# Patient Record
Sex: Male | Born: 1955 | Race: White | Hispanic: No | Marital: Married | Smoking: Never smoker
Health system: Southern US, Community
[De-identification: ages and names within clinical notes are randomized; demographics above are authoritative.]

## PROBLEM LIST (undated history)

## (undated) DIAGNOSIS — Z8679 Personal history of other diseases of the circulatory system: Secondary | ICD-10-CM

## (undated) DIAGNOSIS — Z9621 Cochlear implant status: Secondary | ICD-10-CM

## (undated) HISTORY — PX: COCHLEAR IMPLANT: SUR684

## (undated) HISTORY — DX: Cochlear implant status: Z96.21

## (undated) HISTORY — DX: Personal history of other diseases of the circulatory system: Z86.79

---

## 2005-11-03 ENCOUNTER — Ambulatory Visit (HOSPITAL_COMMUNITY): Admission: RE | Admit: 2005-11-03 | Discharge: 2005-11-03 | Payer: Self-pay | Admitting: Otolaryngology

## 2007-10-06 ENCOUNTER — Ambulatory Visit (HOSPITAL_COMMUNITY): Admission: RE | Admit: 2007-10-06 | Discharge: 2007-10-07 | Payer: Self-pay | Admitting: Otolaryngology

## 2007-12-15 DIAGNOSIS — Z9621 Cochlear implant status: Secondary | ICD-10-CM

## 2007-12-15 HISTORY — DX: Cochlear implant status: Z96.21

## 2008-07-04 IMAGING — CR DG CHEST 2V
2 series · 2 of 2 positions shown · non-contrast
Comparison: None.

CLINICAL DATA: Cochlear implants on the right.  Preoperative respiratory exam. 
 CHEST - 2 VIEW:

[view not recorded (1 of 2)]
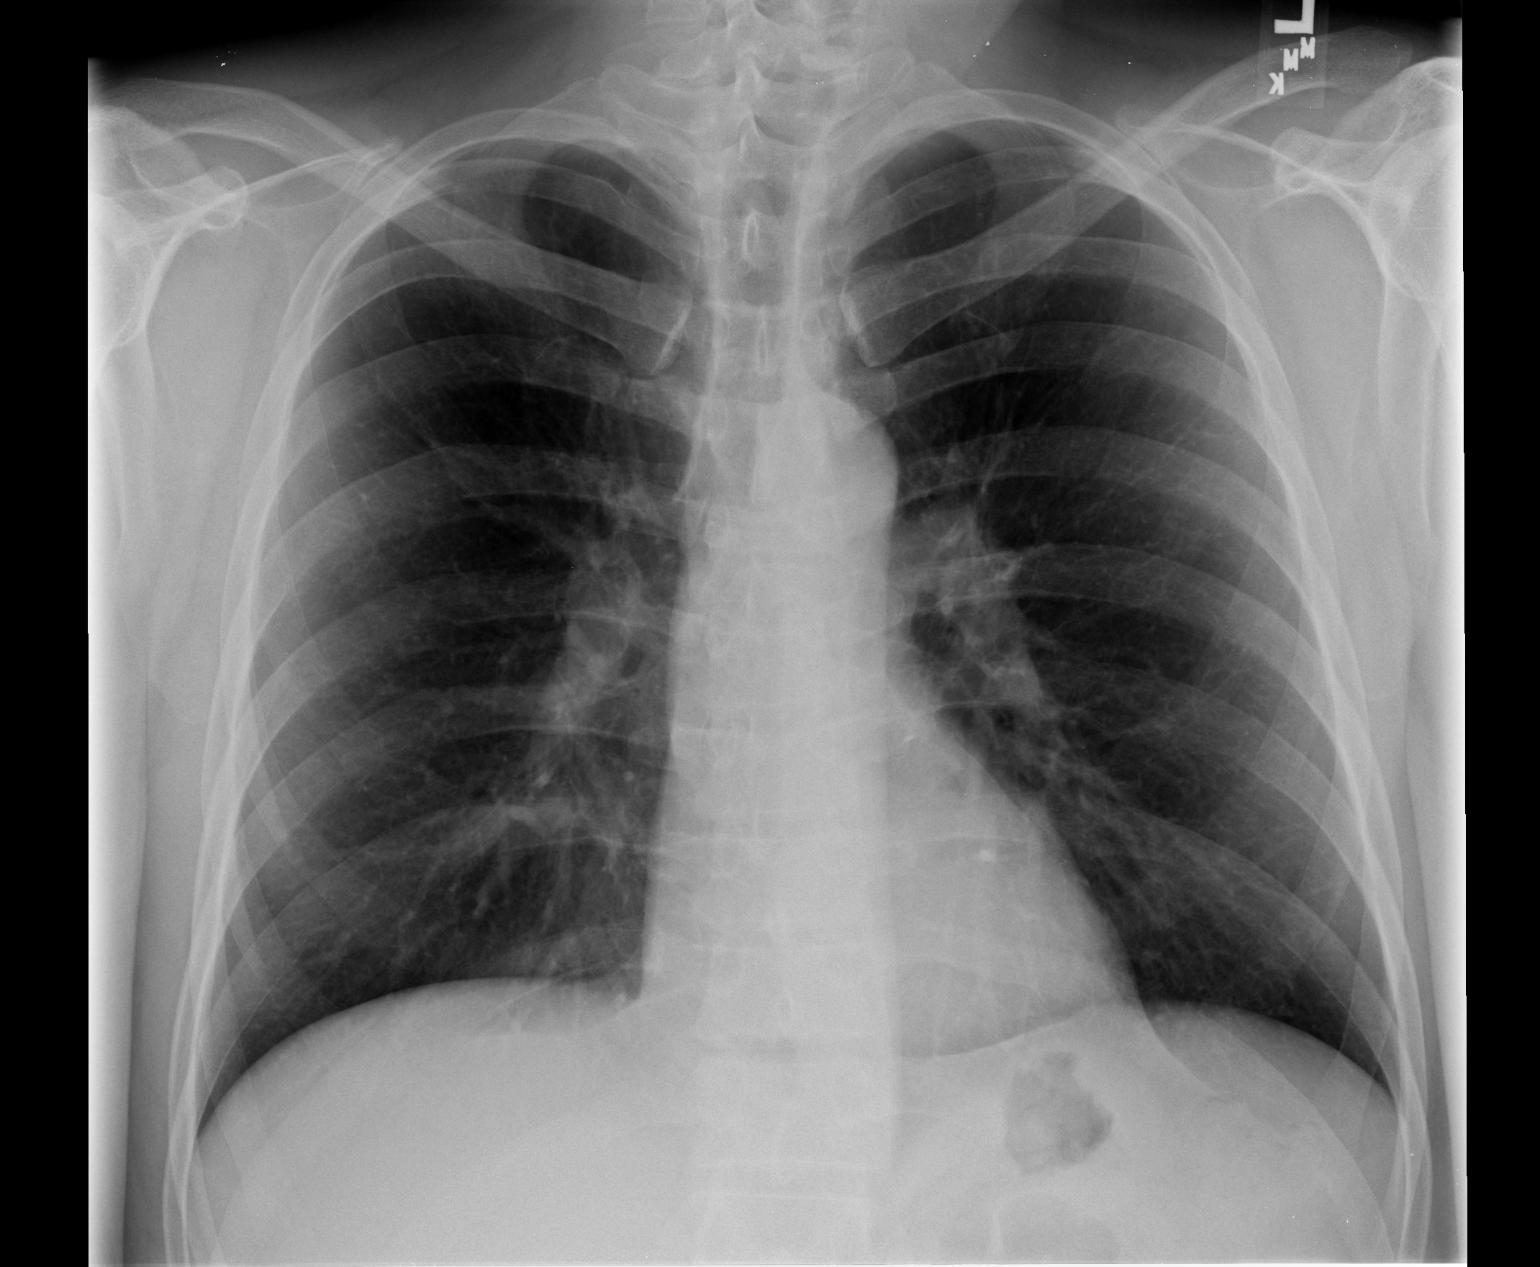

[view not recorded (2 of 2)]
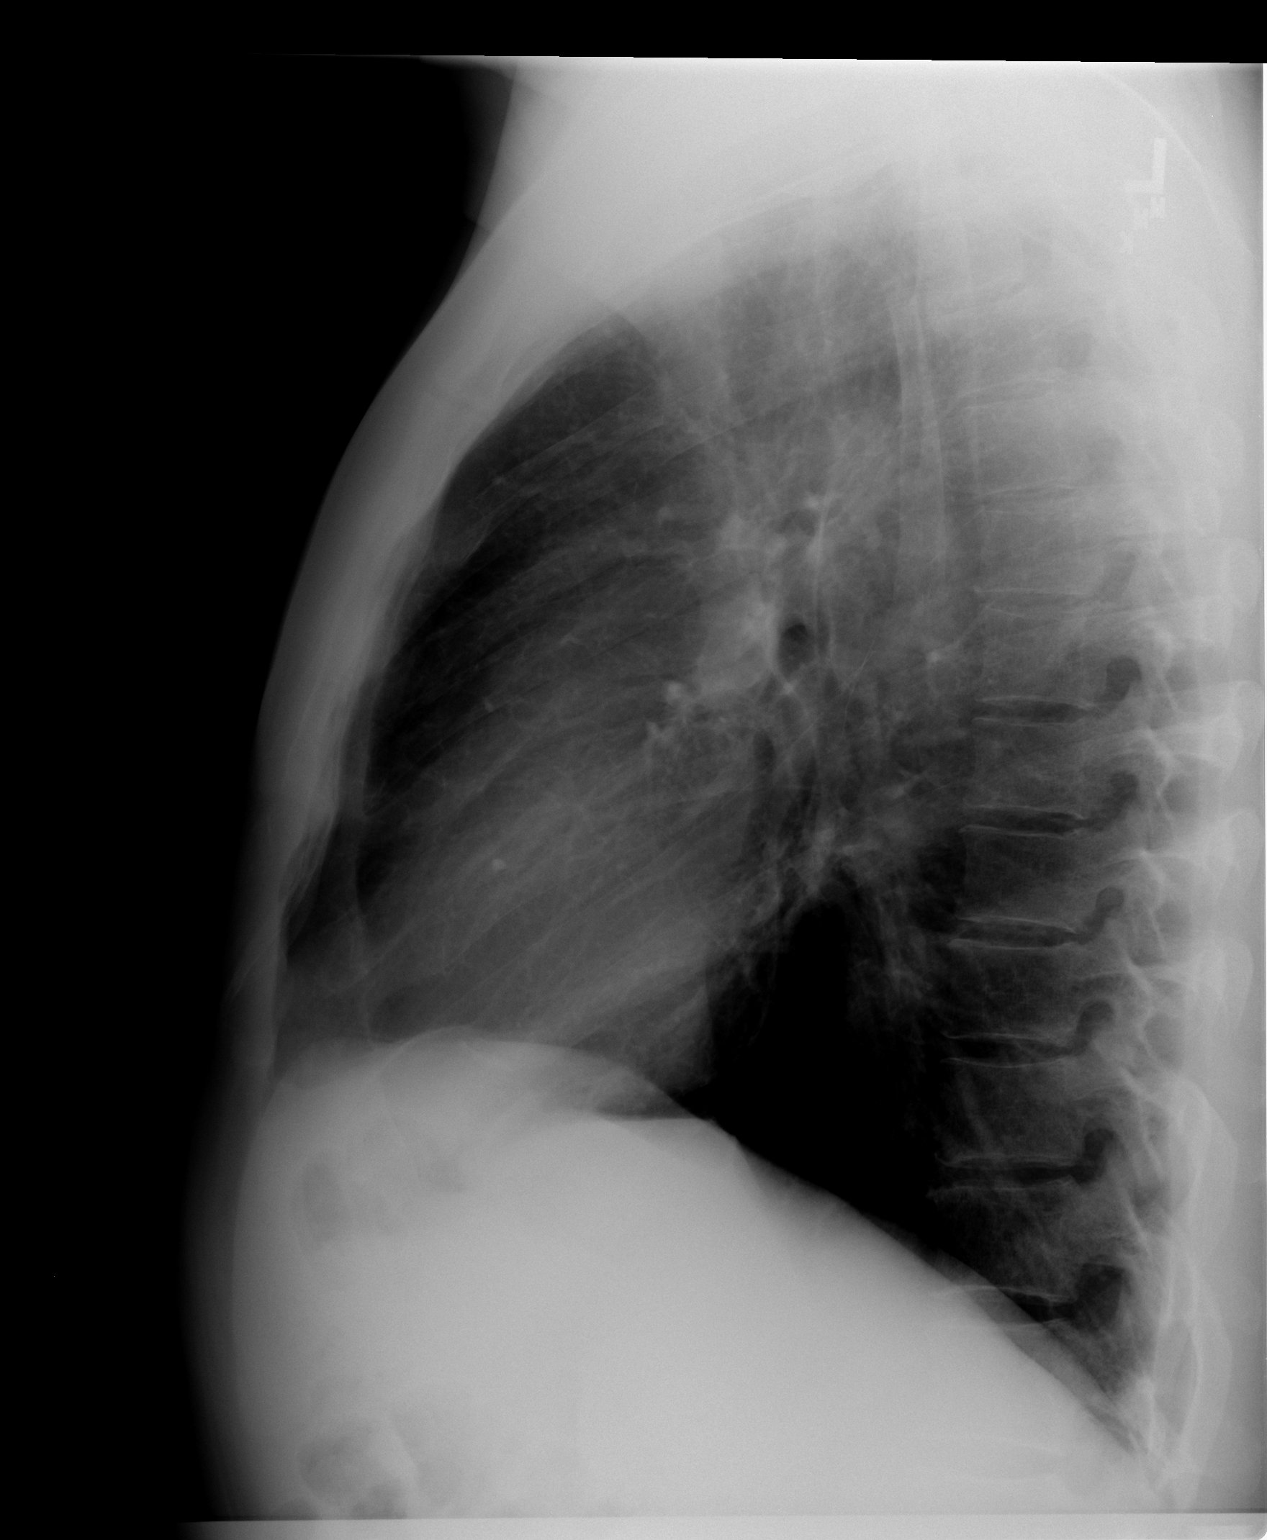

[2 of 2 positions shown; findings below may reference images not displayed]

FINDINGS: Heart size is normal.  The mediastinum is unremarkable.  The lungs are clear.  No effusions.  No soft tissue or bony abnormality.
IMPRESSION: Normal chest.

## 2011-04-28 NOTE — Discharge Summary (Signed)
Dylan Reid, Dylan Reid               ACCOUNT NO.:  [id number]   MEDICAL RECORD NO.:  [id number]          PATIENT TYPE:  OIB   LOCATION:  5741                         FACILITY:  [facility]   PHYSICIAN:  Carolan Shiver, M.D.    DATE OF BIRTH:  11-Oct-1956   DATE OF ADMISSION:  10/06/2007  DATE OF DISCHARGE:  10/07/2007                               DISCHARGE SUMMARY   ADMISSION DIAGNOSIS:  Profound bilateral sensorineural hearing loss  without benefit from hearing aids.   DISCHARGE DIAGNOSIS:  Profound bilateral sensorineural hearing loss  without benefit from hearing aids.   OPERATION:  [employer] Clarion high-resolution 90K with high-focus  1-J electrode multichannel cochlear implant, right ear.  Surgeon:  Carolan Shiver, M.D.  Anesthesia:  General endotracheal, Dr. Jean Rosenthal.   COMPLICATIONS:  None.   DISCHARGE STATUS:  Stable.   SUMMARY OF HOSPITALIZATION:  Dylan Reid is a 55 year old white  male with profound bilateral sensorineural hearing loss, unable to gain  any benefit from hearing aids.  He had had a longstanding hearing loss  in the right ear.  Beginning 4years ago he began to lose all of his  hearing in the left ear.  He was wearing a left BTE hearing aid for  tactile sensation.  He met all the criteria for multichannel cochlear  implantation.  A CT scan of his temporal bones was within normal limits  and an MRI head scan was negative.  He underwent a Pneumovax vaccination  and extensive preoperative counseling.  He understood that there were no  guarantees that he would regain hearing a sensation from a cochlear  implant, and he accepted the risk.  He was told a third of patients were  low performers, a third were medium performers, and a third were high  performers.  He understood that there was brain training involved in  learning how to use a cochlear implant, that it would take at least 12-  24 months for him to enjoy any benefit from the device.  He and his  wife  were extensively counseled concerning multichannel cochlear  implantation.  He read and signed our three-page informed consent form,  version 10.05.  All risks and complications were explained to him,  questions were invited and answered, and the informed consent was signed  and witnessed.   On October 06, 2007, he was taken to [facility] operating room #3 and  underwent an uncomplicated implantation of an ABC Clarion high-res 90K  with high-focus 1-J electrode implant in his right ear.  He had mobile  ossicles.  His facial nerve was intact and stimulated at 0.3 mA  throughout the case.  His chorda tympani nerve was sacrificed as its  course was directly through the middle of the facial recess.  A full-  length insertion of the electrode array was accomplished with a model  number CI-1400-01, serial number [id number].  Bony well was drilled and the  ICS was stabilized by suturing it to the skull with a 5-0 Nurolon  suture.  A drain was used.   The patient was recovered in  the PACU and then transferred to 5700, room  5741, where he had an afebrile, uncomplicated postoperative course.  He  was awake, alert and stable on the night of the procedure and had  minimal JP drainage.  He had intact facial function, no nystagmus, and  had some mild disequilibrium when he got up but no true vertigo.   By the first postoperative day, October 07, 2007, he was awake, alert,  afebrile, and had stable vital signs.  His facial function was intact.  He had no nystagmus and was reporting hunger.  His JP drain was  discontinued.  The incision was stable without hematoma or bleeding.  There was less than 30 mL from his JP drain.   [name] was discharged on the morning of October 07, 2007, with his  wife, who was instructed to return him to my office on October 13, 2007,  at 2:50 p.m.   DISCHARGE MEDICATIONS:  1. Ceftin 500 mg #20, one p.o. b.i.d. x10 days with food.  2. Vicodin one to two p.o.  q.6h. p.r.n. pain, #30.  3. Phenergan #2, 25 mg suppositories one PR q.6h. p.r.n. nausea.  4. Valium 5 mg tabs #20, one p.o. t.i.d. p.r.n. vertigo.   He is to keep his head elevated, avoid pressure over the incision, and  avoid heavy lifting or straining over the next week.  He was to perform  only light activities and follow a regular diet.  He or his wife are to  call [phone] for any postoperative problems.  He and his wife were  given both verbal and written instructions.   Admission laboratory data showed a hemoglobin of 16.7, hematocrit of  49.3, white blood cell count 8900.  PT was 12.4, PTT 33, INR 0.9.  Electrolytes were within normal limits.  Chest x-ray showed no active  disease.  EKG showed some sinus bradycardia.  Postoperative portable x-  rays done in the operating room were read by Dr. Paulina Fusi and showed  the cochlear implant to be in place.  There were no pathology specimens  pending.     During during the hospitalization the patient was in [facility] OR  room #3, the PACU, and 5741.           ______________________________  Carolan Shiver, M.D.     EMK/MEDQ  D:  10/07/2007  T:  10/08/2007  Job:  [id number]   cc:   [facility]

## 2011-04-28 NOTE — H&P (Signed)
NAMEARSENIY, TOOMEY               ACCOUNT NO.:  [id number]   MEDICAL RECORD NO.:  [id number]          PATIENT TYPE:  OIB   LOCATION:  5741                         FACILITY:  [facility]   PHYSICIAN:  Carolan Shiver, M.D.    DATE OF BIRTH:  1956-04-14   DATE OF ADMISSION:  10/06/2007  DATE OF DISCHARGE:  10/07/2007                              HISTORY & PHYSICAL   CHIEF COMPLAINT:  Bilateral profound sensorineural hearing loss not  benefiting from hearing aid, left ear.   HISTORY OF PRESENT ILLNESS:  Dylan Reid is a very pleasant 55-  year-old white male who is being admitted to the hospital for a  multichannel cochlear implant, right ear.  Mr. Sarate presented on  November 03, 2007, with a history of gradual progressive bilateral  sensorineural hearing loss, right greater than left.  He thinks he had  been losing hearing in the right ear for many, many years in a very  gradual process, but at least since 2004.  He then began losing hearing  in his left ear in 2004.  Old audiograms from February 21, 2003, and March 06, 2003, showed moderately severe sensorineural hearing loss in the  right ear with 12% discrimination and a mild sensorineural hearing loss  in the left ear with 96% discrimination.  Over the last four years, he  has continued to go on to lose all the hearing in both ears with no  discrimination.  He was counseled that he might benefit from a  multichannel cochlear implant and that there were no guarantees that he  would regain a hearing sensation.  He was also counseled that he could  do nothing or continue to wear his hearing aid in the left ear.  He  chose to proceed with a multichannel cochlear implant.   In preparation for the multichannel cochlear implant, he underwent CT  scanning of his temporal bones on November 03, 2005.  This showed normal  temporal bones and showed normal cochlear anatomy with 2-1/2 turns of  each cochlear and the labyrinthine and facial  nerve structures were  within normal limits.  He underwent CNC and HINT testing, both of which  showed 0%.  He underwent a Pneumovax vaccine in September 2008 in  preparation for multichannel cochlear implant.   Mr. and Mrs. Macmillan were extensively counseled concerning the risks and  benefits of cochlear implantation.  They understood that there were no  guarantees.  Mr. Holland signed our written three page special cochlear  implant informed consent, version 10.05.  He was also supplied with  cochlear implant information concerning the Harmony System, including  DVTs and written material.   Risks and complications of multichannel cochlear implant were explained  to Mr. and Mrs. Su Hilt.  Questions were invited and answered and  informed consent was signed and witnessed.  He was scheduled for the OR  on October 06, 2007, at 7:30 a.m., room #3 Eligha Bridegroom. [facility] Main OR.   PAST MEDICAL HISTORY:  No serious illnesses or operations reported.   MEDICATIONS:  None reported.  ALLERGIES:  None reported.   FAMILY HISTORY:  Positive for a son who has hearing loss.  His son,  [name], underwent repair of congenital bilateral purulent fistulae in  1990 and 1991.   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  He does use tobacco  products.  Does not drink alcohol.   REVIEW OF SYSTEMS:  Positive for the bilateral sensorineural hearing  loss, otherwise no difficulty with his heart, lungs, liver, kidneys or  history of diabetes mellitus, thyroid dysfunction or GI difficulty.   PHYSICAL EXAMINATION:  GENERAL APPEARANCE:  He is a well-developed, well-  nourished 55 year old white male in no acute distress with hearing  impaired type speech, wearing a BTE hearing aid in the left ear.  The  hearing aid was provided no benefit.  He was awake, alert, stable,  coherent, spontaneous and logical.  HEENT:  Facial  function was intact.  No nystagmus.  Pupils PERLA.  External ears and canals were stable.  TMs were clear and mobile  bilaterally.  Nose negative.  Oral cavity, lips, tongue and palate  normal.  NECK:  Negative.  CHEST:  Clear.  HEART:  Normal sinus rhythm.  ABDOMEN:  Benign.  GENITALIA/RECTAL:  Exams were not done.  EXTREMITIES:  Normal.  NEUROLOGIC:  Exam was physiologic with the exception of the profound  bilateral deafness.   PREOPERATIVE DIAGNOSTIC TESTING:  Audiometric testing on October 03, 2007, showed profound bilateral sensorineural hearing loss with SATs of  90 dB left ear and 100 dB right ear.  CNC and HINT testing showed 0%  correct words.  Preop CT scan of his temporal bones performed November 03, 2005, showed normal cochlear and labyrinthine anatomy.  ENG testing  showed asymmetric chloric responses with 19 degrees per second right  ear, 8 degrees per second left ear.  Promontory testing was not  performed.   Preoperative hemoglobin was 16.7, hematocrit 49.3, white blood cell  count 8900.  PT was 12.4, PTT 33, INR 0.9.  Electrolytes were within  normal limits.   Chest x-ray showed heart was normal in size.  Lungs were clear.  There  were no effusions.  He had a normal chest x-ray.   EKG showed sinus bradycardia.   IMPRESSION:  Profound bilateral sensorineural hearing loss without  benefit from BTE hearing aids.   RECOMMENDATIONS:  1. The patient is recommended for a ABC Clarion high-res 90 K with      high focus 1J electrode multichannel cochlear implant right ear.      He desired the right ear because he thought he still had gained      some slight sound awareness from his hearing aid on the left side.      Eventually will be recommended for binaural implantation.  2. Procedure is scheduled for October 06, 2007, [place]. [facility] Main OR room #3 under general endotracheal anesthesia.      Again, risks and complications of the procedures  were explained to      Mr. and Mrs. Cates questions were invited and answered and our      three page informed consent for cochlear implant version 10.05 was      read and signed by the patient.           ______________________________  Carolan Shiver, M.D.     EMK/MEDQ  D:  10/06/2007  T:  10/07/2007  Job:  [id number]

## 2011-04-28 NOTE — Op Note (Signed)
NAMEYAVIER, Reid NO.:  [id number]   MEDICAL RECORD NO.:  [id number]          PATIENT TYPE:  OIB   LOCATION:  5741                         FACILITY:  [facility]   PHYSICIAN:  Carolan Shiver, M.D.    DATE OF BIRTH:  1956-01-19   DATE OF PROCEDURE:  10/06/2007  DATE OF DISCHARGE:  10/07/2007                               OPERATIVE REPORT   JUSTIFICATION FOR PROCEDURE:  Dylan Reid is a 55 year old white  male here today for a Clarion high-resolution 90K multichannel cochlear  implant, right ear, to treat profound bilateral sensorineural hearing  loss.  Dylan Reid was seen by me on November 02, 2005; at that time, he  had been referred by Ms. Dylan Reid of [facility] in  [place], [place].  Dylan Reid stated that he could not hear  at all from his right ear and had been experiencing progressive hearing  loss in his left ear.  It seemed as if his hearing had been decreasing  for a long time and was gradual.  He began to lose hearing in his left  ear in 2004.  Two old audiograms were available, one from February 21, 2003  and one from March 06, 2003, both of which demonstrated moderately  severe hearing loss in his right ear with 12% discrimination and only  mild sensorineural hearing loss in his left ear with 96% discrimination.  Over that 4-year, he lost all the hearing in both ears, just recently,  the remaining hearing in the left ear.  He denied any otorrhea, otalgia,  tenderness or vertigo.  He had had some noise exposure in his work  environment and around race cars.  He did not use firearms.  There was a  negative family history in terms of his parents and siblings; however,  he has a son who has moderately severe bilateral sensorineural hearing  loss.  His son Dylan Reid had bilateral congenital perilymph fistulae and  underwent sealing of the fistulae during earlier childhood.   Audiometric testing on October 11, 2007 showed virtually  profound  bilateral sensorineural hearing loss with thresholds in the 95- to 110-  dB range across the frequency spectrum and his left ear had an SAT at 90  dB and his right ear, an SAT at 100 dB.  In essence, his hearing aid in  the left ear was not providing any benefit.   Dylan Reid was counseled that he could do nothing, continue to wear his  left hearing aid or proceed with a multichannel cochlear implant; he  desired to have the implant placed in his right ear.  A CT scan of his  temporal bones performed on November 03, 2005 showed normal 2-1/2 turns  of each cochlea and the labyrinthine structures and facial nerve were  within normal limits.  Dylan Reid also underwent HINT and CNC testing.  On the CNC list 2 test, he had 0 correct words and phonemes and on HINT  test , had 0% correct.  He received a Pneumovax vaccine in September  2008 in preparation for the procedure.  Risks and complications of the procedures were explained to [name], questions were invited and answered and informed consent was  read and signed.  He read our 3-page special cochlear implant informed  consent form dated version 10.05.   He was scheduled for the procedure today, October 06, 2007, under  general endotracheal anesthesia with a 24-hour overnight stay.   JUSTIFICATION FOR OUTPATIENT SETTING:  Patient's age and need for  general endotracheal anesthesia.   JUSTIFICATION FOR OVERNIGHT STAY:  Twenty three hours of observation of  facial function, status post multichannel cochlear implantation   PREOPERATIVE DIAGNOSIS:  Profound bilateral sensorineural hearing loss  in both ears.   POSTOPERATIVE DIAGNOSIS:  Profound bilateral sensorineural hearing loss  in both ears.   OPERATION:  1. Clarion high-resolution 90K with 1-J electrode multichannel      cochlear implant, right ear.  2. Microdissection using the operating room microscope.   SURGEON:  Carolan Shiver, M.D.    ANESTHESIA:  General endotracheal.   ANESTHESIOLOGIST:  Jairo Ben, MD   COMPLICATIONS:  None.   SUMMARY OF REPORT:  After the patient was taken to the operating room,  he was placed in the supine position.  IV had been begun in the holding  area.  General IV induction was performed under the guidance of Dr.  Jairo Ben.  The patient was orally intubated.  He was orally  intubated without difficulty.  Eyelids were taped shut and a Foley  catheter was inserted.  A small amount of hair was clipped in the right  postauricular areas, his hair was taped and a stockinette cap was  applied.  His right ear and hemiface were prepped with alcohol and an  inverted J cochlear implant incision was marked and infiltrated with 9  mL of 1% Xylocaine with 1:100,000 epinephrine.  Silver wire needle  electrodes were inserted in the right orbicularis and oculi muscles and  suprasternal notch connected to an M facial nerve monitor pre amplifier.  The patient's right ear and hemiface were then prepped with Betadine and  draped in a standard fashion for a cochlear implant.   An inverted J cochlear implant incision was then created, first by  making the postauricular portion of the incision and later the superior  limb was opened after the skin was infiltrated with another 4 mL of 1%  Xylocaine with 1:100,000 epinephrine.  A T-shaped incision was made in  the mastoid periosteum in the mastoid cortex was exposed.  The mastoid  air cell system was then entered with cutting burs using a continuous  suction and irrigation.  A limited mastoidectomy was performed, enough  to identify the sigmoid sinus and the digastric ridge periosteum.  The  antrum was entered and then the attic was opened to expose the short  process of the incus in the fossa incudis.  The facial recess was then  opened with a #2 match head Soft-Touch neurosurgical bur and 1- and 2-mm  diamond burs.  The ossicles were found to be  intact and mobile.  The  round window niche was normal in  appearance.  A cochleostomy was begun with the 2-mm diamond bur and  carried down until the scala tympani was egg-shelled.   Attention was then turned to the scalp area.  The superior portion of  the incision was then created and a small Palva flap was elevated.  The  previously marked site on the skull was then used at to drill a  bony  well for the ICS; this was done with a well template and sizer.  Trough  was drilled between the well and the posterosuperior lip of the mastoid  cavity, which had been undercut.  Two pairs of holes were then drilled  on either side of the bony well to suture the ICS to the bone.  The site  was then copiously irrigated with bacitracin-containing saline and  unipolar cautery was used to stop all bleeding.   Gowns and gloves were changed.  The cochleostomy was then opened with a  1-mm diamond bur to the size of a cochleostomy sizer.  I could easily  see around the first turn.   Next a Clarion high-resolution 90K cochlear implant with 1-J electrode,  serial number [id number], was opened and inspected; the implant was found to  look intact.  The implant was taken and placed in the bony well and  sutured to the bone with a 5-0 Surgilon suture.  The electrode array was  placed in the trough.   An inserter was then taken and lubricated with saline.  The plastic tube  was attached to the inserter and one electrode was extruded in order to  make sure the extrusion process would go smoothly.  The electrode array  was then withdrawn into the plastic insertion tube.  The tube slot was  angled at 10 o'clock and placed in the cochleostomy and with the tip of  the insertion tube at the first turn.  Gently and carefully, the  electrode array was then inserted into the cochlea without resistance;  this was done in one smooth motion and was not repeated.  Cochleostomy  was then sealed with small fascial squares and  the facial recess was  closed with a fascia muscle plug.  The electrode array was then placed  looped into the mastoid cavity under the undercut lip and held in place  with 3 rectangles of Gelfoam.  The Palva flap was then used to cover  over the fantail portion and electrode array and the mastoid periosteum  was closed.  The site was copiously irrigated with bacitracin-containing  saline.  A #7 Jackson-Pratt drain was inserted through a separate stab  incision.  The postauricular incision was then closed in 2 more layers  using interrupted inverted 3-0 Monocryl for the subcutaneous layer and  skin was closed with a running-locking 5-0 Ethilon.  An intraoperative x-  ray was taken and showed the electrode array to be well within the  cochlea.  Bacitracin ointment was applied to the incision.  The ear was  padded with Telfa and cotton and a standard adult Glasscock mastoid  dressing was applied loosely in the standard fashion.  The patient's  Foley catheter was removed.  He was awakened, extubated and transferred  to his hospital bed.  He appeared to tolerate the general endotracheal  anesthesia and the procedures well and left the operating room in stable  condition.   TOTAL FLUIDS:  2300 mL.   TOTAL BLOOD LOSS:  Less than 20 mL.   TOTAL URINE OUTPUT:  550 mL.   MEDICATIONS GIVEN:  The patient received Ancef 1 g IV and Zofran 4 mg IV  at the beginning and end of the procedure and Decadron 10 mg IV.   Dylan Reid will be admitted to a private room and observed overnight.  If stable and be discharged on October 07, 2007 in the morning.  He will  be instructed to return to my  office on October 13, 2007 at 2:50 p.m.  for suture removal and followup.  Anticipating that he will heal,  external hardware will be applied in approximately 3 weeks.   SUMMARY:  The patient underwent an uncomplicated cochlear implant, right  ear.  The implant used was a ABC Clarion high-resolution 90K implant   with a high-focus 1-J electrode, model number CI-1400-01, serial number  [id number].  A full-length insertion was accomplished on the first attempt.  The cochleostomy was sealed with fascia.  Ossicles were mobile.  The  patient had well-pneumatized mastoid.  There were no complications.  Intraoperative x-ray showed the electrode array to be within the  cochlea.  Facial nerve stimulated at 0.3 mA of the second genu.  Chorda  tympani nerve was sacrificed, as it went directly through the facial  recess.  There were no complications.           ______________________________  Carolan Shiver, M.D.     EMK/MEDQ  D:  10/06/2007  T:  10/07/2007  Job:  [id number]

## 2011-09-23 LAB — COMPREHENSIVE METABOLIC PANEL
ALT: 16
AST: 16
Albumin: 4.3
Alkaline Phosphatase: 61
BUN: 15
CO2: 27
Calcium: 9.8
Chloride: 107
Creatinine, Ser: 0.91
GFR calc Af Amer: >60
GFR calc non Af Amer: >60
Glucose, Bld: 91
Potassium: 4.9
Sodium: 142
Total Bilirubin: 0.7
Total Protein: 6.4

## 2011-09-23 LAB — DIFFERENTIAL
Basophils Absolute: 0
Basophils Relative: 1
Eosinophils Absolute: 0.3
Eosinophils Relative: 3
Lymphocytes Relative: 25
Lymphs Abs: 2.2
Monocytes Absolute: 0.7
Monocytes Relative: 8
Neutro Abs: 5.7
Neutrophils Relative %: 64

## 2011-09-23 LAB — URINALYSIS, ROUTINE W REFLEX MICROSCOPIC
Bilirubin Urine: NEGATIVE
Glucose, UA: NEGATIVE
Hgb urine dipstick: NEGATIVE
Ketones, ur: NEGATIVE
Nitrite: NEGATIVE
Protein, ur: NEGATIVE
Specific Gravity, Urine: 1.023
Urobilinogen, UA: 0.2
pH: 5

## 2011-09-23 LAB — APTT: aPTT: 33

## 2011-09-23 LAB — CBC
HCT: 49.3
Hemoglobin: 16.7
MCHC: 33.8
MCV: 96.3
Platelets: 167
RBC: 5.13
RDW: 13.3
WBC: 8.9

## 2011-09-23 LAB — PROTIME-INR
INR: 0.9
Prothrombin Time: 12.4

## 2015-10-15 DIAGNOSIS — H903 Sensorineural hearing loss, bilateral: Secondary | ICD-10-CM | POA: Diagnosis not present

## 2015-12-15 DIAGNOSIS — Z8679 Personal history of other diseases of the circulatory system: Secondary | ICD-10-CM

## 2015-12-15 HISTORY — DX: Personal history of other diseases of the circulatory system: Z86.79

## 2016-02-24 DIAGNOSIS — H2513 Age-related nuclear cataract, bilateral: Secondary | ICD-10-CM | POA: Diagnosis not present

## 2016-11-10 ENCOUNTER — Emergency Department (HOSPITAL_COMMUNITY): Payer: Medicare Other

## 2016-11-10 ENCOUNTER — Inpatient Hospital Stay (HOSPITAL_COMMUNITY)
Admission: EM | Admit: 2016-11-10 | Discharge: 2016-11-12 | DRG: 066 | Disposition: A | Discharge status: Home / Self Care | Payer: Medicare Other | Attending: Neurosurgery | Admitting: Neurosurgery

## 2016-11-10 ENCOUNTER — Encounter (HOSPITAL_COMMUNITY): Payer: Self-pay | Admitting: Emergency Medicine

## 2016-11-10 DIAGNOSIS — S066X0A Traumatic subarachnoid hemorrhage without loss of consciousness, initial encounter: Secondary | ICD-10-CM

## 2016-11-10 DIAGNOSIS — I609 Nontraumatic subarachnoid hemorrhage, unspecified: Secondary | ICD-10-CM | POA: Diagnosis not present

## 2016-11-10 DIAGNOSIS — Z9621 Cochlear implant status: Secondary | ICD-10-CM | POA: Diagnosis not present

## 2016-11-10 DIAGNOSIS — R51 Headache: Secondary | ICD-10-CM | POA: Diagnosis not present

## 2016-11-10 DIAGNOSIS — H9193 Unspecified hearing loss, bilateral: Secondary | ICD-10-CM | POA: Diagnosis present

## 2016-11-10 DIAGNOSIS — R030 Elevated blood-pressure reading, without diagnosis of hypertension: Secondary | ICD-10-CM | POA: Diagnosis not present

## 2016-11-10 DIAGNOSIS — G44209 Tension-type headache, unspecified, not intractable: Secondary | ICD-10-CM | POA: Diagnosis not present

## 2016-11-10 DIAGNOSIS — R061 Stridor: Secondary | ICD-10-CM | POA: Diagnosis not present

## 2016-11-10 DIAGNOSIS — R58 Hemorrhage, not elsewhere classified: Secondary | ICD-10-CM | POA: Diagnosis not present

## 2016-11-10 HISTORY — DX: Nontraumatic subarachnoid hemorrhage, unspecified: I60.9

## 2016-11-10 LAB — CBC WITH DIFFERENTIAL/PLATELET
Basophils Absolute: 0 10*3/uL (ref 0.0–0.1)
Basophils Relative: 0 %
Eosinophils Absolute: 0 10*3/uL (ref 0.0–0.7)
Eosinophils Relative: 0 %
HCT: 47.8 % (ref 39.0–52.0)
Hemoglobin: 16.5 g/dL (ref 13.0–17.0)
LYMPHS PCT: 16 %
Lymphs Abs: 1.8 10*3/uL (ref 0.7–4.0)
MCH: 33 pg (ref 26.0–34.0)
MCHC: 34.5 g/dL (ref 30.0–36.0)
MCV: 95.6 fL (ref 78.0–100.0)
Monocytes Absolute: 0.8 10*3/uL (ref 0.1–1.0)
Monocytes Relative: 7 %
NEUTROS ABS: 8.9 10*3/uL — AB (ref 1.7–7.7)
Neutrophils Relative %: 77 %
Platelets: 160 10*3/uL (ref 150–400)
RBC: 5 MIL/uL (ref 4.22–5.81)
RDW: 13.1 % (ref 11.5–15.5)
WBC: 11.5 10*3/uL — ABNORMAL HIGH (ref 4.0–10.5)

## 2016-11-10 LAB — COMPREHENSIVE METABOLIC PANEL
ALT: 25 U/L (ref 17–63)
AST: 20 U/L (ref 15–41)
Albumin: 4 g/dL (ref 3.5–5.0)
Alkaline Phosphatase: 50 U/L (ref 38–126)
Anion gap: 7 (ref 5–15)
BUN: 11 mg/dL (ref 6–20)
CO2: 21 mmol/L — ABNORMAL LOW (ref 22–32)
Calcium: 8.8 mg/dL — ABNORMAL LOW (ref 8.9–10.3)
Chloride: 109 mmol/L (ref 101–111)
Creatinine, Ser: 0.56 mg/dL — ABNORMAL LOW (ref 0.61–1.24)
GFR calc Af Amer: >60 mL/min (ref >60)
GFR calc non Af Amer: >60 mL/min (ref >60)
Glucose, Bld: 124 mg/dL — ABNORMAL HIGH (ref 65–99)
Potassium: 3.6 mmol/L (ref 3.5–5.1)
Sodium: 137 mmol/L (ref 135–145)
Total Bilirubin: 0.6 mg/dL (ref 0.3–1.2)
Total Protein: 6.4 g/dL — ABNORMAL LOW (ref 6.5–8.1)

## 2016-11-10 LAB — MRSA PCR SCREENING: MRSA by PCR: NEGATIVE

## 2016-11-10 LAB — LIPASE, BLOOD: Lipase: 17 U/L (ref 11–51)

## 2016-11-10 MED ORDER — METOCLOPRAMIDE HCL 5 MG/ML IJ SOLN
10.0000 mg | Freq: Once | INTRAMUSCULAR | Status: AC
  Administered 2016-11-10: 10 mg via INTRAVENOUS
  Filled 2016-11-10: qty 2

## 2016-11-10 MED ORDER — ACETAMINOPHEN 325 MG PO TABS: 650.0000 mg | ORAL_TABLET | ORAL | Status: DC | PRN

## 2016-11-10 MED ORDER — NIMODIPINE 60 MG/20ML PO SOLN: 60.0000 mg | ORAL | Status: DC

## 2016-11-10 MED ORDER — IOPAMIDOL (ISOVUE-370) INJECTION 76%
75.0000 mL | Freq: Once | INTRAVENOUS | Status: AC | PRN
  Administered 2016-11-10: 75 mL via INTRAVENOUS

## 2016-11-10 MED ORDER — SODIUM CHLORIDE 0.9 % IV SOLN
INTRAVENOUS | Status: DC
  Administered 2016-11-10 – 2016-11-11 (×4): via INTRAVENOUS

## 2016-11-10 MED ORDER — STROKE: EARLY STAGES OF RECOVERY BOOK
Freq: Once | Status: AC
  Administered 2016-11-10: 18:00:00
  Filled 2016-11-10: qty 1

## 2016-11-10 MED ORDER — SENNOSIDES-DOCUSATE SODIUM 8.6-50 MG PO TABS
1.0000 | ORAL_TABLET | Freq: Two times a day (BID) | ORAL | Status: DC
  Administered 2016-11-10 – 2016-11-12 (×4): 1 via ORAL
  Filled 2016-11-10 (×4): qty 1

## 2016-11-10 MED ORDER — LABETALOL HCL 5 MG/ML IV SOLN
20.0000 mg | Freq: Once | INTRAVENOUS | Status: AC
  Administered 2016-11-10: 10 mg via INTRAVENOUS

## 2016-11-10 MED ORDER — DIPHENHYDRAMINE HCL 50 MG/ML IJ SOLN
50.0000 mg | Freq: Once | INTRAMUSCULAR | Status: AC
  Administered 2016-11-10: 50 mg via INTRAVENOUS
  Filled 2016-11-10: qty 1

## 2016-11-10 MED ORDER — TRAMADOL HCL 50 MG PO TABS
50.0000 mg | ORAL_TABLET | Freq: Four times a day (QID) | ORAL | Status: DC | PRN
  Administered 2016-11-10 – 2016-11-12 (×7): 100 mg via ORAL
  Filled 2016-11-10 (×6): qty 2

## 2016-11-10 MED ORDER — HYDROMORPHONE HCL 1 MG/ML IJ SOLN: 0.5000 mg | INTRAMUSCULAR | Status: DC | PRN

## 2016-11-10 MED ORDER — KETOROLAC TROMETHAMINE 30 MG/ML IJ SOLN
15.0000 mg | Freq: Once | INTRAMUSCULAR | Status: AC
  Administered 2016-11-10: 15 mg via INTRAVENOUS
  Filled 2016-11-10: qty 1

## 2016-11-10 MED ORDER — FENTANYL CITRATE (PF) 100 MCG/2ML IJ SOLN
25.0000 ug | Freq: Once | INTRAMUSCULAR | Status: AC
Start: 2016-11-10 — End: 2016-11-10
  Administered 2016-11-10: 25 ug via INTRAVENOUS
  Filled 2016-11-10: qty 2

## 2016-11-10 MED ORDER — PANTOPRAZOLE SODIUM 40 MG IV SOLR
40.0000 mg | Freq: Every day | INTRAVENOUS | Status: DC
  Administered 2016-11-10: 40 mg via INTRAVENOUS
  Filled 2016-11-10: qty 40

## 2016-11-10 MED ORDER — NIMODIPINE 30 MG PO CAPS
60.0000 mg | ORAL_CAPSULE | ORAL | Status: DC
  Administered 2016-11-10 – 2016-11-12 (×11): 60 mg via ORAL
  Filled 2016-11-10 (×11): qty 2

## 2016-11-10 MED ORDER — ACETAMINOPHEN 650 MG RE SUPP: 650.0000 mg | RECTAL | Status: DC | PRN

## 2016-11-10 MED ORDER — CLEVIDIPINE BUTYRATE 0.5 MG/ML IV EMUL: 0.0000 mg/h | INTRAVENOUS | Status: DC

## 2016-11-10 MED ORDER — SODIUM CHLORIDE 0.9 % IV SOLN: INTRAVENOUS | Status: AC

## 2016-11-10 NOTE — ED Notes (Signed)
Receiving Unit aware of pt transfer with EMS at this time.

## 2016-11-10 NOTE — H&P (Signed)
Dylan Reid is an 60 y.o. male.   Chief Complaint: Headache HPI: 60 year old male with sudden onset of headache after vomiting approximately 48 hours ago. Headache became worse today causing evaluation in emergency department. Head CT scan demonstrates evidence of subarachnoid hemorrhage around his basilar cisterns. CT angiogram negative for aneurysm or vascular malformation. Patient with no other symptoms beyond headache. No further nausea vomiting. No history of seizure. No history of hypertension.  History reviewed. No pertinent past medical history.  Past Surgical History:  Procedure Laterality Date  . COCHLEAR IMPLANT Right     No family history on file. Social History:  reports that he has never smoked. He has never used smokeless tobacco. He reports that he does not drink alcohol. His drug history is not on file.  Allergies: No Known Allergies  No prescriptions prior to admission.    Results for orders placed or performed during the hospital encounter of 11/10/16 (from the past 48 hour(s))  Comprehensive metabolic panel     Status: Abnormal   Collection Time: 11/10/16  9:25 AM  Result Value Ref Range   Sodium 137 135 - 145 mmol/L   Potassium 3.6 3.5 - 5.1 mmol/L   Chloride 109 101 - 111 mmol/L   CO2 21 (L) 22 - 32 mmol/L   Glucose, Bld 124 (H) 65 - 99 mg/dL   BUN 11 6 - 20 mg/dL   Creatinine, Ser 0.56 (L) 0.61 - 1.24 mg/dL   Calcium 8.8 (L) 8.9 - 10.3 mg/dL   Total Protein 6.4 (L) 6.5 - 8.1 g/dL   Albumin 4.0 3.5 - 5.0 g/dL   AST 20 15 - 41 U/L   ALT 25 17 - 63 U/L   Alkaline Phosphatase 50 38 - 126 U/L   Total Bilirubin 0.6 0.3 - 1.2 mg/dL   GFR calc non Af Amer >60 >60 mL/min   GFR calc Af Amer >60 >60 mL/min    Comment: (NOTE) The eGFR has been calculated using the CKD EPI equation. This calculation has not been validated in all clinical situations. eGFR's persistently <60 mL/min signify possible Chronic Kidney Disease.    Anion gap 7 5 - 15  Lipase, blood      Status: None   Collection Time: 11/10/16  9:25 AM  Result Value Ref Range   Lipase 17 11 - 51 U/L  CBC with Differential     Status: Abnormal   Collection Time: 11/10/16  9:25 AM  Result Value Ref Range   WBC 11.5 (H) 4.0 - 10.5 K/uL   RBC 5.00 4.22 - 5.81 MIL/uL   Hemoglobin 16.5 13.0 - 17.0 g/dL   HCT 47.8 39.0 - 52.0 %   MCV 95.6 78.0 - 100.0 fL   MCH 33.0 26.0 - 34.0 pg   MCHC 34.5 30.0 - 36.0 g/dL   RDW 13.1 11.5 - 15.5 %   Platelets 160 150 - 400 K/uL   Neutrophils Relative % 77 %   Neutro Abs 8.9 (H) 1.7 - 7.7 K/uL   Lymphocytes Relative 16 %   Lymphs Abs 1.8 0.7 - 4.0 K/uL   Monocytes Relative 7 %   Monocytes Absolute 0.8 0.1 - 1.0 K/uL   Eosinophils Relative 0 %   Eosinophils Absolute 0.0 0.0 - 0.7 K/uL   Basophils Relative 0 %   Basophils Absolute 0.0 0.0 - 0.1 K/uL   Ct Angio Head W Or Wo Contrast  Result Date: 11/10/2016 CLINICAL DATA:  Subarachnoid hemorrhage. EXAM: CT ANGIOGRAPHY HEAD TECHNIQUE:  Multidetector CT imaging of the head was performed using the standard protocol during bolus administration of intravenous contrast. Multiplanar CT image reconstructions and MIPs were obtained to evaluate the vascular anatomy. CONTRAST:  75 mL Isovue 370 COMPARISON:  Noncontrast head CT earlier today FINDINGS: Anterior circulation: The internal carotid arteries are widely patent from skullbase to carotid termini. There is minimal siphon atherosclerosis bilaterally. ACAs and MCAs are patent without evidence of major branch occlusion or significant stenosis. No aneurysm or vascular malformation is identified. Posterior circulation: The visualized distal vertebral arteries are widely patent and codominant. AICA and SCA origins are patent. Basilar artery is patent. Posterior communicating arteries are not identified. PCAs are patent without evidence of significant stenosis. No aneurysm or vascular malformation is identified. Venous sinuses: Patent. Anatomic variants: None. Delayed  phase: Subarachnoid hemorrhage as described on the earlier CT, greatest in the perimesencephalic region. No abnormal enhancement. Streak artifact from right cochlear implant. IMPRESSION: No intracranial aneurysm identified. This may reflect nonaneurysmal perimesencephalic subarachnoid hemorrhage. Electronically Signed   By: Logan Bores M.D.   On: 11/10/2016 12:11   Ct Head Wo Contrast  Result Date: 11/10/2016 CLINICAL DATA:  Frontal headache since Sunday, vomiting. EXAM: CT HEAD WITHOUT CONTRAST TECHNIQUE: Contiguous axial images were obtained from the base of the skull through the vertex without intravenous contrast. COMPARISON:  None. FINDINGS: Brain: There is blood noted within the basilar cisterns compatible with subarachnoid hemorrhage. No hydrocephalus. No mass effect or midline shift. No acute infarction. Vascular: No visible aneurysm or abnormality. Skull: No acute calvarial abnormality. Changes of implant noted in the right middle ear. Sinuses/Orbits: Visualized paranasal sinuses and mastoids clear. Orbital soft tissues unremarkable. Other: None IMPRESSION: Subarachnoid hemorrhage in the basilar cisterns. While this is a classic location for perimesencephalic bleed, cannot exclude a ruptured aneurysm. Recommend CTA for further evaluation. Critical Value/emergent results were called by telephone at the time of interpretation on 11/10/2016 at 10:56 am to Dr. Francine Graven , who verbally acknowledged these results. Electronically Signed   By: Rolm Baptise M.D.   On: 11/10/2016 11:00    Pertinent items noted in HPI and remainder of comprehensive ROS otherwise negative.  Blood pressure (!) 145/94, pulse 73, temperature 98.3 F (36.8 C), resp. rate 15, height 6' (1.829 m), weight 84 kg (185 lb 3 oz), SpO2 97 %.  Patient is awake and alert. He is oriented and appropriate. Speech is fluent. Judgment and insight appear intact. Cranial nerve function with decreased hearing bilaterally. 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 Patient with chronic right facial weakness. Otherwise cranial nerves intact. Motor and sensory examination extremities normal. No pronator drift. Cerebellar testing negative. Reflexes normal. Examination head ears eyes and throat is unremarkable. Chest and abdomen are benign. Extremities are free from injury or deformity. Assessment/Plan Nontraumatic subarachnoid hemorrhage likely secondary to venous hemorrhage. Admit to ICU for observation. Keep well-hydrated. I do not feel that conventional arteriography as necessary given a good quality CT angiogram  Halimah Bewick A 11/10/2016, 5:42 PM

## 2016-11-10 NOTE — ED Notes (Signed)
Pt in CTA

## 2016-11-10 NOTE — ED Notes (Signed)
Per Rudean HaskellJennifer K. "[facility] has no available trucks" and to call [facility] .  Called [facility].

## 2016-11-10 NOTE — ED Triage Notes (Signed)
Patient complains of headache that started Sunday. Patient has cochlear implant and called Dr. Dorma RussellKraus and was told it probably isn't the implant. Patient states nausea and vomiting.

## 2016-11-10 NOTE — ED Provider Notes (Signed)
[facility]-EMER[facility]ENCY DEPT Provider Note   CSN: [id number] Arrival date & time: 11/10/16  0809     History   Chief Complaint Chief Complaint  Patient presents with  . Migraine    HPI Dylan Reid is a 60 y.o. male.  HPI  Pt was seen at 0905. Per pt and his wife, c/o gradual onset and persistence of constant headache that began 2 days ago. Pt states his headache began in his forehead and is now "all over." Pt states he has been taking motrin without full relief. Describes the headache as "dull" and "aching." Pt states he had a "very forceful episode" of N/V 2 days ago, followed by "aching all over." Denies any new symptoms, denies any further N/V. Denies diarrhea, no fevers, no abd pain, no CP/SOB, no syncope/near syncope, no visual changes, no focal motor weakness, no tingling/numbness in extremities, no ataxia, no slurred speech, no facial droop.     History reviewed. No pertinent past medical history.  There are no active problems to display for this patient.   Past Surgical History:  Procedure Laterality Date  . COCHLEAR IMPLANT Right      Home Medications    Prior to Admission medications   Not on File    Family History No family history on file.  Social History Social History  Substance Use Topics  . Smoking status: Never Smoker  . Smokeless tobacco: Never Used  . Alcohol use No     Allergies   Patient has no known allergies.   Review of Systems Review of Systems ROS: Statement: All systems negative except as marked or noted in the HPI; Constitutional: Negative for fever and chills. ; ; Eyes: Negative for eye pain, redness and discharge. ; ; ENMT: Negative for ear pain, hoarseness, nasal congestion, sinus pressure and sore throat. ; ; Cardiovascular: Negative for chest pain, palpitations, diaphoresis, dyspnea and peripheral edema. ; ; Respiratory: Negative for cough, wheezing and stridor. ; ; Gastrointestinal: +N/V. Negative for diarrhea, abdominal pain,  blood in stool, hematemesis, jaundice and rectal bleeding. . ; ; Genitourinary: Negative for dysuria, flank pain and hematuria. ; ; Musculoskeletal: Negative for back pain and neck pain. Negative for swelling and trauma.; ; Skin: Negative for pruritus, rash, abrasions, blisters, bruising and skin lesion.; ; Neuro: +headache. Negative for lightheadedness and neck stiffness. Negative for weakness, altered level of consciousness, altered mental status, extremity weakness, paresthesias, involuntary movement, seizure and syncope.     Physical Exam Updated Vital Signs BP 159/98 (BP Location: Left Arm)   Pulse 62   Temp 98.1 F (36.7 C) (Oral)   Resp 22   Ht 6' (1.829 m)   Wt 185 lb (83.9 kg)   SpO2 100%   BMI 25.09 kg/m   Physical Exam 0910: Physical examination:  Nursing notes reviewed; Vital signs and O2 SAT reviewed;  Constitutional: Well developed, Well nourished, Well hydrated, In no acute distress; Head:  Normocephalic, atraumatic. +cochlear implant right. +chronic rash to forehead.; Eyes: EOMI, PERRL, No scleral icterus; ENMT: Left TM clear. Mouth and pharynx normal, Mucous membranes moist; Neck: Supple, Full range of motion, No lymphadenopathy. No meningeal signs.; Cardiovascular: Regular rate and rhythm, No gallop; Respiratory: Breath sounds clear & equal bilaterally, No wheezes.  Speaking full sentences with ease, Normal respiratory effort/excursion; Chest: Nontender, Movement normal; Abdomen: Soft, Nontender, Nondistended, Normal bowel sounds; Genitourinary: No CVA tenderness; Spine:  No midline CS, TS, LS tenderness.;; Extremities: Pulses normal, No tenderness, No edema, No calf edema or asymmetry.; Neuro:  AA&Ox3, +HOH per hx, otherwise major CN grossly intact. No facial droop. Speech clear. Grips equal. No gross focal motor or sensory deficits in extremities.; Skin: Color normal, Warm, Dry.   ED Treatments / Results  Labs (all labs ordered are listed, but only abnormal results are  displayed)   EKG  EKG Interpretation None       Radiology   Procedures Procedures (including critical care time)  Medications Ordered in ED Medications  ketorolac (TORADOL) 30 MG/ML injection 15 mg (not administered)  diphenhydrAMINE (BENADRYL) injection 50 mg (not administered)  metoCLOPramide (REGLAN) injection 10 mg (not administered)     Initial Impression / Assessment and Plan / ED Course  I have reviewed the triage vital signs and the nursing notes.  Pertinent labs & imaging results that were available during my care of the patient were reviewed by me and considered in my medical decision making (see chart for details).  MDM Reviewed: previous chart, nursing note and vitals Reviewed previous: labs Interpretation: labs and CT scan Total time providing critical care: 30-74 minutes. This excludes time spent performing separately reportable procedures and services. Consults: Neurosurgery.   CRITICAL CARE Performed by: Laray AngerMCMANUS,Kelse Ploch M Total critical care time: 35 minutes Critical care time was exclusive of separately billable procedures and treating other patients. Critical care was necessary to treat or prevent imminent or life-threatening deterioration. Critical care was time spent personally by me on the following activities: development of treatment plan with patient and/or surrogate as well as nursing, discussions with consultants, evaluation of patient's response to treatment, examination of patient, obtaining history from patient or surrogate, ordering and performing treatments and interventions, ordering and review of laboratory studies, ordering and review of radiographic studies, pulse oximetry and re-evaluation of patient's condition.   Results for orders placed or performed during the hospital encounter of 11/10/16  Comprehensive metabolic panel  Result Value Ref Range   Sodium 137 135 - 145 mmol/L   Potassium 3.6 3.5 - 5.1 mmol/L   Chloride 109 101 -  111 mmol/L   CO2 21 (L) 22 - 32 mmol/L   Glucose, Bld 124 (H) 65 - 99 mg/dL   BUN 11 6 - 20 mg/dL   Creatinine, Ser 2.950.56 (L) 0.61 - 1.24 mg/dL   Calcium 8.8 (L) 8.9 - 10.3 mg/dL   Total Protein 6.4 (L) 6.5 - 8.1 g/dL   Albumin 4.0 3.5 - 5.0 g/dL   AST 20 15 - 41 U/L   ALT 25 17 - 63 U/L   Alkaline Phosphatase 50 38 - 126 U/L   Total Bilirubin 0.6 0.3 - 1.2 mg/dL   GFR calc non Af Amer >60 >60 mL/min   GFR calc Af Amer >60 >60 mL/min   Anion gap 7 5 - 15  Lipase, blood  Result Value Ref Range   Lipase 17 11 - 51 U/L  CBC with Differential  Result Value Ref Range   WBC 11.5 (H) 4.0 - 10.5 K/uL   RBC 5.00 4.22 - 5.81 MIL/uL   Hemoglobin 16.5 13.0 - 17.0 g/dL   HCT 62.147.8 30.839.0 - 65.752.0 %   MCV 95.6 78.0 - 100.0 fL   MCH 33.0 26.0 - 34.0 pg   MCHC 34.5 30.0 - 36.0 g/dL   RDW 84.613.1 96.211.5 - 95.215.5 %   Platelets 160 150 - 400 K/uL   Neutrophils Relative % 77 %   Neutro Abs 8.9 (H) 1.7 - 7.7 K/uL   Lymphocytes Relative 16 %   Lymphs Abs 1.8  0.7 - 4.0 K/uL   Monocytes Relative 7 %   Monocytes Absolute 0.8 0.1 - 1.0 K/uL   Eosinophils Relative 0 %   Eosinophils Absolute 0.0 0.0 - 0.7 K/uL   Basophils Relative 0 %   Basophils Absolute 0.0 0.0 - 0.1 K/uL   Ct Head Wo Contrast Result Date: 11/10/2016 CLINICAL DATA:  Frontal headache since Sunday, vomiting. EXAM: CT HEAD WITHOUT CONTRAST TECHNIQUE: Contiguous axial images were obtained from the base of the skull through the vertex without intravenous contrast. COMPARISON:  None. FINDINGS: Brain: There is blood noted within the basilar cisterns compatible with subarachnoid hemorrhage. No hydrocephalus. No mass effect or midline shift. No acute infarction. Vascular: No visible aneurysm or abnormality. Skull: No acute calvarial abnormality. Changes of implant noted in the right middle ear. Sinuses/Orbits: Visualized paranasal sinuses and mastoids clear. Orbital soft tissues unremarkable. Other: None IMPRESSION: Subarachnoid hemorrhage in the basilar  cisterns. While this is a classic location for perimesencephalic bleed, cannot exclude a ruptured aneurysm. Recommend CTA for further evaluation. Critical Value/emergent results were called by telephone at the time of interpretation on 11/10/2016 at 10:56 am to Dr. Samuel Jester , who verbally acknowledged these results. Electronically Signed   By: Charlett Nose M.D.   On: 11/10/2016 11:00   Ct Angio Head W Or Wo Contrast Result Date: 11/10/2016 CLINICAL DATA:  Subarachnoid hemorrhage. EXAM: CT ANGIOGRAPHY HEAD TECHNIQUE: Multidetector CT imaging of the head was performed using the standard protocol during bolus administration of intravenous contrast. Multiplanar CT image reconstructions and MIPs were obtained to evaluate the vascular anatomy. CONTRAST:  75 mL Isovue 370 COMPARISON:  Noncontrast head CT earlier today FINDINGS: Anterior circulation: The internal carotid arteries are widely patent from skullbase to carotid termini. There is minimal siphon atherosclerosis bilaterally. ACAs and MCAs are patent without evidence of major branch occlusion or significant stenosis. No aneurysm or vascular malformation is identified. Posterior circulation: The visualized distal vertebral arteries are widely patent and codominant. AICA and SCA origins are patent. Basilar artery is patent. Posterior communicating arteries are not identified. PCAs are patent without evidence of significant stenosis. No aneurysm or vascular malformation is identified. Venous sinuses: Patent. Anatomic variants: None. Delayed phase: Subarachnoid hemorrhage as described on the earlier CT, greatest in the perimesencephalic region. No abnormal enhancement. Streak artifact from right cochlear implant. IMPRESSION: No intracranial aneurysm identified. This may reflect nonaneurysmal perimesencephalic subarachnoid hemorrhage. Electronically Signed   By: Sebastian Ache M.D.   On: 11/10/2016 12:11    1125:  Will obtain CT-A head for further  delineation of above. Pt remains A&O, neuro exam intact. Dx and testing d/w pt and family.  Questions answered.  Verb understanding, agreeable to transfer to [facility] for admit.  T/C to [facility] Neurosurgery Dr. Jordan Likes, case discussed, including:  HPI, pertinent PM/SHx, VS/PE, dx testing, ED course and treatment:  Agreeable to accept transfer/admit to [facility] Neuro ICU, aware CT-A pending.   1215:  CT-A as above. No change in pt assessment. Pt and family updated regarding plan of care, d/w Neurosurgery, and CT-A findings.    Final Clinical Impressions(s) / ED Diagnoses   Final diagnoses:  None    New Prescriptions New Prescriptions   No medications on file     Samuel Jester, DO 11/14/16 0018

## 2016-11-10 NOTE — ED Notes (Addendum)
EMS at bedside. Pt spouse aware of pt disposition. EDP completing EMTALA.  Pt spouse, Ronalee Beltsheresa Mell, contact number [phone].

## 2016-11-11 MED ORDER — PANTOPRAZOLE SODIUM 40 MG PO TBEC
40.0000 mg | DELAYED_RELEASE_TABLET | Freq: Every day | ORAL | Status: DC
  Administered 2016-11-11: 40 mg via ORAL
  Filled 2016-11-11: qty 1

## 2016-11-11 NOTE — Progress Notes (Signed)
OT Cancellation Note  Patient Details Name: Lacretia LeighJerry W Smyser MRN: [id number] DOB: 07/20/1956   Cancelled Treatment:    Reason Eval/Treat Not Completed: Patient not medically ready (bedrest)  [facility],HILL[facility]  Eugean Arnott, OTR/L  [phone] 11/11/2016 11/11/2016, 9:44 AM

## 2016-11-11 NOTE — Evaluation (Signed)
Speech Language Pathology Evaluation Patient Details Name: Dylan Reid MRN: [id number] DOB: 1956-07-05 Today's Date: 11/11/2016 Time: 1445-1510 SLP Time Calculation (min) (ACUTE ONLY): 25 min  Problem List:  Patient Active Problem List   Diagnosis Date Noted  . SAH (subarachnoid hemorrhage) (HCC) 11/10/2016  . Subarachnoid hemorrhage (HCC) 11/10/2016   Past Medical History: History reviewed. No pertinent past medical history. Past Surgical History:  Past Surgical History:  Procedure Laterality Date  . COCHLEAR IMPLANT Right    HPI:  60 year old male with sudden onset of headache after vomiting approximately 48 hours ago. Headache became worse today causing evaluation in emergency department. Head CT scan demonstrates evidence of subarachnoid hemorrhage around his basilar cisterns. CT angiogram negative for aneurysm or vascular malformation. Patient with no other symptoms beyond headache. No further nausea vomiting. No history of seizure. No history of hypertension. MRI revealed Subarachnoid hemorrhage, greatest in the perimesencephalic region. No intracranial aneurysm identified. Pt has cochlear implant.    Assessment / Plan / Recommendation Clinical Impression  Pt's cognitive-linguistic function appears functional at this time. Pt able to particpate in high level problem solving tasks. Nursing agrees and pt is agreeable to ST signing off on care.     SLP Assessment  Patient does not need any further Speech Lanaguage Pathology Services    Follow Up Recommendations  None          SLP Evaluation Cognition  Overall Cognitive Status: Within Functional Limits for tasks assessed Arousal/Alertness: Awake/alert Orientation Level: Oriented X4 Attention: Selective Selective Attention: Appears intact Memory: Appears intact Awareness: Appears intact Problem Solving: Appears intact Safety/Judgment: Appears intact       Comprehension  Auditory Comprehension Overall Auditory  Comprehension: Appears within functional limits for tasks assessed Yes/No Questions: Within Functional Limits Commands: Within Functional Limits Conversation: Simple Visual Recognition/Discrimination Discrimination: Not tested Reading Comprehension Reading Status: Not tested    Expression Expression Primary Mode of Expression: Verbal Verbal Expression Overall Verbal Expression: Appears within functional limits for tasks assessed Initiation: No impairment Level of Generative/Spontaneous Verbalization: Conversation Repetition: No impairment Naming: No impairment Pragmatics: No impairment Non-Verbal Means of Communication: Not applicable Written Expression Written Expression: Not tested   Oral / Motor  Oral Motor/Sensory Function Overall Oral Motor/Sensory Function: Within functional limits Motor Speech Overall Motor Speech: Appears within functional limits for tasks assessed Respiration: Within functional limits Phonation: Normal Resonance: Within functional limits Articulation: Within functional limitis Intelligibility: Intelligible Motor Planning: Witnin functional limits Motor Speech Errors: Not applicable   GO            Dylan Reid. Dylan Reid, M.S., CCC-SLP Speech-Language Pathologist [phone]         Dylan Reid Dylan Reid 11/11/2016, 4:44 PM

## 2016-11-11 NOTE — Care Management Note (Signed)
Case Management Note  Patient Details  Name: Dylan LeighJerry W Plair MRN: [id number] Date of Birth: [birth year]  Subjective/Objective:    Pt admitted on 11/10/16 with HA and SAH.  PTA, pt independent, lives with spouse.                  Action/Plan: PT recommending no OP follow up at dc.  Will follow for discharge planning as pt progresses.    Expected Discharge Date:                  Expected Discharge Plan:  Home/Self Care  In-House Referral:     Discharge planning Services  CM Consult  Post Acute Care Choice:    Choice offered to:     DME Arranged:    DME Agency:     HH Arranged:    HH Agency:     Status of Service:  In process, will continue to follow  If discussed at Long Length of Stay Meetings, dates discussed:    Additional Comments:  Glennon Macmerson, Jaevon Paras M, RN 11/11/2016, 4:56 PM

## 2016-11-11 NOTE — Progress Notes (Signed)
Patient overall doing well. Still with moderate headache. No new neurologic symptoms otherwise.  Awake and alert. Oriented and appropriate. Speech fluent. Cranial nerve function intact. Motor and sensory function extremities normal.  Status post nonaneurysmal subarachnoid hemorrhage. Continue ICU observation. If stable tomorrow probable discharge home.

## 2016-11-11 NOTE — Evaluation (Signed)
Physical Therapy Evaluation Patient Details Name: Dylan Reid MRN: [id number] DOB: [birth year] Today's Date: 11/11/2016   History of Present Illness  Patient is a 60 y/o male with no PMH presents with sudden onset of headache after vomiting. Head CT-subarachnoid hemorrhage around his basilar cisterns  Clinical Impression  Patient presents with headache and mild weakness due to being in bed for 12 hours. Tolerated gait training and stair training with supervision-mod I for safety. Balance improved with increased distance and activity. Pt has support from spouse at home. Encouraged mobility a few times per day with RN while in hospital. Pt does not require skilled therapy services. Education completed. Discharge from therapy.    Follow Up Recommendations No PT follow up;Supervision - Intermittent    Equipment Recommendations  None recommended by PT    Recommendations for Other Services       Precautions / Restrictions Precautions Precautions: None Precaution Comments: watch BP Restrictions Weight Bearing Restrictions: No      Mobility  Bed Mobility Overal bed mobility: Modified Independent             General bed mobility comments: No assist needed.   Transfers Overall transfer level: Needs assistance Equipment used: None Transfers: Sit to/from Stand Sit to Stand: Modified independent (Device/Increase time)         General transfer comment: Stood from EOB without difficulty, no assist needed.  Ambulation/Gait Ambulation/Gait assistance: Supervision Ambulation Distance (Feet): 300 Feet Assistive device: None Gait Pattern/deviations: Step-through pattern;Decreased stride length;Drifts right/left   Gait velocity interpretation: Below normal speed for age/gender General Gait Details: mostly steady gait with only mild balance deficits noted but improved during ambulation.  Stairs Stairs: Yes Stairs assistance: Modified independent (Device/Increase time) Stair  Management: Alternating pattern;One rail Right Number of Stairs: 13 General stair comments: Cues for technique, no difficulties.   Wheelchair Mobility    Modified Rankin (Stroke Patients Only)       Balance Overall balance assessment: No apparent balance deficits (not formally assessed)                                           Pertinent Vitals/Pain Pain Assessment: 0-10 Pain Score: 5  Pain Location: head Pain Descriptors / Indicators: Headache Pain Intervention(s): Monitored during session;Repositioned;RN gave pain meds during session    Home Living Family/patient expects to be discharged to:: Private residence Living Arrangements: Spouse/significant other Available Help at Discharge: Family;Available 24 hours/day Type of Home: House Home Access: Stairs to enter Entrance Stairs-Rails: Right Entrance Stairs-Number of Steps: 3 Home Layout: Two level;Able to live on main level with bedroom/bathroom Home Equipment: None      Prior Function Level of Independence: Independent         Comments: Drives, works in yard.     Hand Dominance        Extremity/Trunk Assessment   Upper Extremity Assessment: Defer to OT evaluation           Lower Extremity Assessment: Generalized weakness (but functional)      Cervical / Trunk Assessment: Normal  Communication   Communication: HOH (cochlear implant)  Cognition Arousal/Alertness: Awake/alert Behavior During Therapy: WFL for tasks assessed/performed Overall Cognitive Status: Within Functional Limits for tasks assessed                      General Comments      Exercises  Assessment/Plan    PT Assessment Patent does not need any further PT services  PT Problem List            PT Treatment Interventions      PT Goals (Current goals can be found in the Care Plan section)  Acute Rehab PT Goals Patient Stated Goal: to go home PT Goal Formulation: All assessment and  education complete, DC therapy    Frequency     Barriers to discharge        Co-evaluation               End of Session Equipment Utilized During Treatment: Gait belt Activity Tolerance: Patient tolerated treatment well Patient left: in chair;with call bell/phone within reach;with nursing/sitter in room Nurse Communication: Mobility status         Time: 1411-1435 PT Time Calculation (min) (ACUTE ONLY): 24 min   Charges:   PT Evaluation $PT Eval Low Complexity: 1 Procedure PT Treatments $Gait Training: 8-22 mins   PT G Codes:        Dylan Reid 11/11/2016, 2:39 PM Mylo RedShauna Keiarra Charon, PT, DPT [phone]

## 2016-11-11 NOTE — Progress Notes (Signed)
PT Cancellation Note  Patient Details Name: Lacretia LeighJerry W Lusk MRN: [id number] DOB: 03/25/1956   Cancelled Treatment:    Reason Eval/Treat Not Completed: Patient not medically ready Pt on bedrest. Will await increase in activity orders prior to PT eval.   Blake DivineShauna A Layton Tappan 11/11/2016, 8:41 AM Mylo RedShauna Ameya Kutz, PT, DPT [phone]

## 2016-11-12 MED ORDER — TRAMADOL HCL 50 MG PO TABS: 50.0000 mg | ORAL_TABLET | Freq: Four times a day (QID) | ORAL | 1 refills | Status: DC | PRN

## 2016-11-12 NOTE — Discharge Instructions (Signed)
Subarachnoid Hemorrhage °Subarachnoid hemorrhage is bleeding in the area between the brain and the membrane that covers the brain. The bleeding puts more pressure on the brain and stops blood from reaching some areas of the brain. It is very serious. It may cause brain damage, stroke, or death if not treated. You must be treated in the hospital right away. °What increases the risk? °You may be more likely to have this condition if you: °· Smoke. °· Have high blood pressure (hypertension). °· Drink too much alcohol. °· Are a male, especially after menopause. °· Have a family history of disease in the blood vessels of the brain. °· Have a certain inherited kidney disease or connective tissue disease. °What are the signs or symptoms? °· Having a sudden, severe headache. °· Feeling sick to your stomach (nauseous) or throwing up (vomiting) combined with other problems. °· Suddenly feeling weak. °· Losing feeling on your face, arm, or leg, especially on one side of the body. °· Suddenly having trouble walking or moving your arms or legs. °· Suddenly feeling confused. °· Suddenly having a change in mood or personality. °· Having trouble talking or understanding. °· Having trouble swallowing. °· Suddenly having trouble seeing. °· Seeing double. °· Feeling dizzy. °· Losing your balance or coordination. °· Having light bother or hurt your eyes. °· Having a stiff neck. °Follow these instructions at home: °· Take all medicines exactly as told by your doctor. °· Eat healthy foods if you can swallow. °¨ Eat foods that are low in salt and cholesterol. °¨ Eat foods that are low in saturated and trans fat. °¨ If told, eat soft or pureed foods so that you do not choke. °¨ If told, take small bites of food so that you do not choke. °· Rest as told by your doctor. °· Limit your activity as told by your doctor. °· Do not smoke. °· Limit how much alcohol you drink. °¨ Men-drink no more than 2 drinks a day. °¨ Women who are not  pregnant-drink no more than 1 drink a day. °· Make changes to your lifestyle as told by your doctor. °· Keep track of your blood pressure as told by your doctor. °· Keep your home safe so you do not fall. °¨ Put grab bars in the bedroom and bathroom. °¨ Raise toilet seats. °¨ Put a seat in the shower. °· Go to therapy sessions as told by your doctor. This may include physical, occupational, and speech therapy. °· Use a walker or cane at all times, if told to do so. °· Keep all follow-up visits with your doctor and other specialists. °Get help right away if: °· You have a sudden, severe headache with no known cause. °· You are sick to your stomach or throw up, and have another problem. °· You have a sudden weakness. °· You lose feeling on one side of your body. °· You suddenly have trouble walking or moving arms or legs. °· You suddenly feel confused. °· You have trouble talking or understanding. °· You suddenly have trouble seeing. °· You lose your balance or your movements are not coordinated. °· You have a stiff neck. °· You have trouble breathing. °· You are partly or totally unaware of what is going on around you. °The symptoms above may be a sign of a serious problem that is an emergency. Do not wait to see if the symptoms will go away. Get medical help right away. Call your local emergency services (911 in [place]   Do not drive yourself to the hospital.  This information is not intended to replace advice given to you by your health care provider. Make sure you discuss any questions you have with your health care provider. Document Released: 03/27/2013 Document Revised: 05/07/2016 Document Reviewed: 01/13/2013 Elsevier Interactive Patient Education  2017 [employer].

## 2016-11-12 NOTE — Discharge Summary (Signed)
Physician Discharge Summary  Patient ID: Dylan Reid MRN: [id number] DOB/AGE: 1956/02/13 60 y.o.  Admit date: 11/10/2016 Discharge date: 11/12/2016  Admission Diagnoses:  Discharge Diagnoses:  Active Problems:   SAH (subarachnoid hemorrhage) (HCC)   Subarachnoid hemorrhage (HCC)   Discharged Condition: good  Hospital Course: Patient admitted to the hospital for treatment and evaluation of nonaneurysmal spontaneous subarachnoid hemorrhage. Patient continues to have mild headache. No other neurologic deficits. Patient feels comfortable with home discharge.   Consults:   Significant Diagnostic Studies:   Treatments:   Discharge Exam: Blood pressure (!) 141/80, pulse (!) 55, temperature 97.4 F (36.3 C), temperature source Axillary, resp. rate 12, height 6' (1.829 m), weight 84 kg (185 lb 3 oz), SpO2 93 %. Awake and alert. Oriented and appropriate. Speech is fluent. Judgment and insight are intact. Cranial nerve function normal bilaterally. Motor 5/5. No pronator drift. Sensory exam intact. Chest and abdomen benign.  Disposition:      Medication List    TAKE these medications   traMADol 50 MG tablet Commonly known as:  ULTRAM Take 1-2 tablets (50-100 mg total) by mouth every 6 (six) hours as needed for moderate pain.      Follow-up Information    Trenee Igoe A, MD Follow up.   Specialty:  Neurosurgery Contact information: [address] [place] [address] [phone]           Signed: Temple PaciniOOL,Arienne Gartin A 11/12/2016, 8:02 AM

## 2016-11-12 NOTE — Plan of Care (Signed)
Problem: Nutrition: Goal: Dietary intake will improve Outcome: Completed/Met Date Met: 11/12/16 Regular diet.  Good appetite

## 2016-12-21 DIAGNOSIS — L718 Other rosacea: Secondary | ICD-10-CM | POA: Diagnosis not present

## 2016-12-22 ENCOUNTER — Other Ambulatory Visit: Payer: Self-pay | Admitting: *Deleted

## 2016-12-22 ENCOUNTER — Encounter: Payer: Self-pay | Admitting: Family Medicine

## 2016-12-22 ENCOUNTER — Encounter (INDEPENDENT_AMBULATORY_CARE_PROVIDER_SITE_OTHER): Payer: Self-pay

## 2016-12-22 ENCOUNTER — Ambulatory Visit (INDEPENDENT_AMBULATORY_CARE_PROVIDER_SITE_OTHER): Payer: Medicare Other | Admitting: Family Medicine

## 2016-12-22 ENCOUNTER — Encounter: Payer: Self-pay | Admitting: *Deleted

## 2016-12-22 VITALS — BP 125/72 | HR 78 | Temp 96.9°F | Ht 72.0 in | Wt 202.8 lb

## 2016-12-22 DIAGNOSIS — Z0000 Encounter for general adult medical examination without abnormal findings: Secondary | ICD-10-CM | POA: Insufficient documentation

## 2016-12-22 DIAGNOSIS — E663 Overweight: Secondary | ICD-10-CM | POA: Insufficient documentation

## 2016-12-22 DIAGNOSIS — I609 Nontraumatic subarachnoid hemorrhage, unspecified: Secondary | ICD-10-CM

## 2016-12-22 DIAGNOSIS — Z9621 Cochlear implant status: Secondary | ICD-10-CM | POA: Insufficient documentation

## 2016-12-22 HISTORY — DX: Encounter for general adult medical examination without abnormal findings: Z00.00

## 2016-12-22 NOTE — Progress Notes (Signed)
   HPI  Patient presents today here to establish care.   Patient has interesting recent medical history of spontaneous subarachnoid hemorrhage. He was monitored in the ICU 2 days and discharge. He states he still has intermittent headaches but overall is doing very well. He has a history of cochlear implant 2008.  He is reasonably physically active but still avoiding strenuous activity. He does not watch his diet.  Patient does not want colonoscopy, he is willing to try and FOBT.  He does not want a flu shot He has had issues with erections and would lik eto make sure it is safe to try viagra with neurosurg before trying.   PMH: Smoking status noted History of cochlear implant, subarachnoid hemorrhage Social history: Married for 40 years, denies alcohol and drug use Surgical history of cochlear implant  ROS: Per HPI  Objective: BP 125/72   Pulse 78   Temp (!) 96.9 F (36.1 C) (Oral)   Ht 6' (1.829 m)   Wt 202 lb 12.8 oz (92 kg)   BMI 27.50 kg/m  Gen: NAD, alert, cooperative with exam HEENT: NCAT, it sided cochlear implant in place, oropharynx clear, PERRLA, EOMI CV: RRR, good S1/S2, no murmur Resp: CTABL, no wheezes, non-labored Abd: SNTND, BS present, no guarding or organomegaly Ext: No edema, warm Neuro: Alert and oriented, No gross deficits  Assessment and plan:  # Annual physical exam Normal exam except for overweight, discussed therapeutic lifestyle changes Patient is not interested in any healthcare maintenance, we started the discussion today. FOBT card ordered and given. Screening labs ordered  # subarachnoid hemorrhage Spontaneous hemorrhage with no signs of recurrence. Referral written for routine follow-up  # Erectile dysfunction Patient's hesitant to try Viagra considering his hemorrhage, he will hopefully discuss this with his neurosurgeon. Explained it is safe    Orders Placed This Encounter  Procedures  . Lipid panel    Standing Status:    Future    Standing Expiration Date:   12/22/2017  . CMP14+EGFR    Standing Status:   Future    Standing Expiration Date:   12/22/2017  . CBC with Differential/Platelet    Standing Status:   Future    Standing Expiration Date:   12/22/2017  . Ambulatory referral to Neurosurgery    Referral Priority:   Routine    Referral Type:   Surgical    Referral Reason:   Specialty Services Required    Requested Specialty:   Neurosurgery    Number of Visits Requested:   Sellers, MD [facility] 12/22/2016, 2:06 PM

## 2016-12-22 NOTE — Patient Instructions (Signed)
Great to meet you!  Come back in 1-2 weeks for fasting labs.   We will call and arrange a follow up with Dr. Jordan LikesPool and let you know when there is an appointment

## 2016-12-24 DIAGNOSIS — H903 Sensorineural hearing loss, bilateral: Secondary | ICD-10-CM | POA: Diagnosis not present

## 2016-12-28 ENCOUNTER — Other Ambulatory Visit: Payer: Medicare Other

## 2016-12-28 DIAGNOSIS — Z0000 Encounter for general adult medical examination without abnormal findings: Secondary | ICD-10-CM

## 2016-12-28 DIAGNOSIS — Z1211 Encounter for screening for malignant neoplasm of colon: Secondary | ICD-10-CM | POA: Diagnosis not present

## 2016-12-28 DIAGNOSIS — Z7689 Persons encountering health services in other specified circumstances: Secondary | ICD-10-CM | POA: Diagnosis not present

## 2016-12-28 DIAGNOSIS — E663 Overweight: Secondary | ICD-10-CM

## 2016-12-29 LAB — CBC WITH DIFFERENTIAL/PLATELET
BASOS ABS: 0.1 10*3/uL (ref 0.0–0.2)
Basos: 1 %
EOS (ABSOLUTE): 0.2 10*3/uL (ref 0.0–0.4)
Eos: 2 %
HEMOGLOBIN: 16.2 g/dL (ref 13.0–17.7)
Hematocrit: 46.9 % (ref 37.5–51.0)
IMMATURE GRANS (ABS): 0 10*3/uL (ref 0.0–0.1)
IMMATURE GRANULOCYTES: 0 %
LYMPHS: 30 %
Lymphocytes Absolute: 2.2 10*3/uL (ref 0.7–3.1)
MCH: 32.9 pg (ref 26.6–33.0)
MCHC: 34.5 g/dL (ref 31.5–35.7)
MCV: 95 fL (ref 79–97)
MONOCYTES: 9 %
Monocytes Absolute: 0.7 10*3/uL (ref 0.1–0.9)
NEUTROS ABS: 4.3 10*3/uL (ref 1.4–7.0)
NEUTROS PCT: 58 %
PLATELETS: 177 10*3/uL (ref 150–379)
RBC: 4.92 x10E6/uL (ref 4.14–5.80)
RDW: 13.4 % (ref 12.3–15.4)
WBC: 7.4 10*3/uL (ref 3.4–10.8)

## 2016-12-29 LAB — CMP14+EGFR
ALT: 23 IU/L (ref 0–44)
AST: 19 IU/L (ref 0–40)
Albumin/Globulin Ratio: 2 (ref 1.2–2.2)
Albumin: 4.3 g/dL (ref 3.5–5.5)
Alkaline Phosphatase: 55 IU/L (ref 39–117)
BUN/Creatinine Ratio: 20 (ref 9–20)
BUN: 17 mg/dL (ref 6–24)
Bilirubin Total: 0.5 mg/dL (ref 0.0–1.2)
CALCIUM: 9.3 mg/dL (ref 8.7–10.2)
CO2: 21 mmol/L (ref 18–29)
Chloride: 104 mmol/L (ref 96–106)
Creatinine, Ser: 0.83 mg/dL (ref 0.76–1.27)
GFR, EST AFRICAN AMERICAN: 112 mL/min/{1.73_m2} (ref >59)
GFR, EST NON AFRICAN AMERICAN: 97 mL/min/{1.73_m2} (ref >59)
GLUCOSE: 110 mg/dL — AB (ref 65–99)
Globulin, Total: 2.1 g/dL (ref 1.5–4.5)
Potassium: 4.3 mmol/L (ref 3.5–5.2)
Sodium: 141 mmol/L (ref 134–144)
TOTAL PROTEIN: 6.4 g/dL (ref 6.0–8.5)

## 2016-12-29 LAB — LIPID PANEL
CHOL/HDL RATIO: 4.6 ratio (ref 0.0–5.0)
Cholesterol, Total: 174 mg/dL (ref 100–199)
HDL: 38 mg/dL — ABNORMAL LOW (ref >39)
LDL CALC: 118 mg/dL — AB (ref 0–99)
Triglycerides: 88 mg/dL (ref 0–149)
VLDL Cholesterol Cal: 18 mg/dL (ref 5–40)

## 2016-12-29 LAB — PSA: PROSTATE SPECIFIC AG, SERUM: 0.6 ng/mL (ref 0.0–4.0)

## 2016-12-30 LAB — FECAL OCCULT BLOOD, IMMUNOCHEMICAL: Fecal Occult Bld: NEGATIVE

## 2016-12-30 LAB — HGB A1C W/O EAG: Hgb A1c MFr Bld: 6 % — ABNORMAL HIGH (ref 4.8–5.6)

## 2016-12-30 LAB — SPECIMEN STATUS REPORT

## 2016-12-31 ENCOUNTER — Telehealth: Payer: Self-pay | Admitting: *Deleted

## 2017-01-01 NOTE — Telephone Encounter (Signed)
Pt notified of results Verbalizes understanding 

## 2017-01-01 NOTE — Progress Notes (Signed)
Pt notified of results Verbalizes understanding 

## 2017-01-05 DIAGNOSIS — H903 Sensorineural hearing loss, bilateral: Secondary | ICD-10-CM | POA: Diagnosis not present

## 2017-07-05 DIAGNOSIS — H2513 Age-related nuclear cataract, bilateral: Secondary | ICD-10-CM | POA: Diagnosis not present

## 2018-02-10 ENCOUNTER — Ambulatory Visit (INDEPENDENT_AMBULATORY_CARE_PROVIDER_SITE_OTHER): Payer: Medicare Other | Admitting: *Deleted

## 2018-02-10 ENCOUNTER — Encounter: Payer: Self-pay | Admitting: *Deleted

## 2018-02-10 VITALS — BP 127/81 | HR 73 | Ht 72.0 in | Wt 197.0 lb

## 2018-02-10 DIAGNOSIS — Z0000 Encounter for general adult medical examination without abnormal findings: Secondary | ICD-10-CM

## 2018-02-10 NOTE — Progress Notes (Addendum)
Subjective:   Dylan Reid is a 62 y.o. male who presents for an Initial Medicare Annual Wellness Visit. [name] is married and lives at home with his wife. He has two adult children and two grandchildren. 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 He recently received recognition from the [facility] Commisioners for his contributions.   Review of Systems  Health is about the same as last year  Cardiac Risk Factors include: advanced age (>75men, >6 women);male gender  History of spontaneous subarachnoid hemorrhage   Other systems negative  Objective:    Today's Vitals   02/10/18 0846  BP: 127/81  Pulse: 73  Weight: 197 lb (89.4 kg)  Height: 6' (1.829 m)   Body mass index is 26.72 kg/m.  Advanced Directives 02/10/2018 11/10/2016 11/10/2016  Does Patient Have a Medical Advance Directive? Yes Yes Yes  Type of Estate agent of [place];Living will Living will;Healthcare Power of Attorney Living will  Does patient want to make changes to medical advance directive? No - Patient declined No - Patient declined -  Copy of Healthcare Power of Attorney in Chart? No - copy requested No - copy requested -    Current Medications (verified) Outpatient Encounter Medications as of 02/10/2018  Medication Sig  . doxycycline (VIBRAMYCIN) 50 MG capsule Take 1 capsule by mouth 2 (two) times daily.  . traMADol (ULTRAM) 50 MG tablet Take 1-2 tablets (50-100 mg total) by mouth every 6 (six) hours as needed for moderate pain. (Patient not taking: Reported on 02/10/2018)   No facility-administered encounter medications on file as of 02/10/2018.     Allergies (verified) Patient has no known allergies.   History: Past Medical History:  Diagnosis Date  . Cochlear implant in place 12/2007  . History of subarachnoid hemorrhage 2017   Past Surgical History:  Procedure Laterality Date  . COCHLEAR IMPLANT Right    Family  History  Problem Relation Age of Onset  . Diabetes Father   . Healthy Son   . Healthy Son    Social History   Socioeconomic History  . Marital status: Married    Spouse name: Not on file  . Number of children: 2  . Years of education: 29  . Highest education level: 12th grade  Social Needs  . Financial resource strain: Not hard at all  . Food insecurity - worry: Never true  . Food insecurity - inability: Never true  . Transportation needs - medical: No  . Transportation needs - non-medical: No  Occupational History  . Occupation: Disabled    Comment: Dentist  Tobacco Use  . Smoking status: Never Smoker  . Smokeless tobacco: Never Used  Substance and Sexual Activity  . Alcohol use: No  . Drug use: No  . Sexual activity: Yes  Other Topics Concern  . Not on file  Social History Narrative  . Not on file   Tobacco Counseling No tobacco use  Clinical Intake:     Pain : No/denies pain     Nutritional Status: BMI of 19-24  Normal Diabetes: No  How often do you need to have someone help you when you read instructions, pamphlets, or other written materials from your doctor or pharmacy?: 1 - Never What is the last grade level you completed in school?: 12  Interpreter Needed?: No  Information entered by :: Demetrios Loll, RN  Activities of Daily Living In your present state of health, do you have  any difficulty performing the following activities: 02/10/2018  Hearing? Y  Comment cochlear implant. Does well with that  Vision? Y  Comment Has glasses for distance vision. Last eye exam was last year in [place]  Difficulty concentrating or making decisions? N  Walking or climbing stairs? N  Dressing or bathing? N  Doing errands, shopping? N  Preparing Food and eating ? N  Using the Toilet? N  In the past six months, have you accidently leaked urine? N  Do you have problems with loss of bowel control? N  Managing your Medications? N  Managing your  Finances? N  Housekeeping or managing your Housekeeping? N  Some recent data might be hidden     Immunizations and Health Maintenance  There is no immunization history on file for this patient. Health Maintenance Due  Topic Date Due  . Hepatitis C Screening  10/09/1956  . HIV Screening  04/23/1973  . COLONOSCOPY  04/23/2008    Patient Care Team: Elenora Gamma, MD as PCP - General (Family Medicine)  No hospitalizations, ER visits, or surgeries this past year.      Assessment:   This is a routine wellness examination for [name].  Hearing/Vision screen No deficits noted during visit.   Dietary issues and exercise activities discussed: Current Exercise Habits: Home exercise routine(Stays physically active daily), Type of exercise: walking(Stays active daily around the house and yard. Does things like splits wood and does yard work), Time (Minutes): 60, Frequency (Times/Week): 7, Weekly Exercise (Minutes/Week): 420, Intensity: Moderate, Exercise limited by: None identified  Goals    . Exercise 150 min/wk Moderate Activity     Continue to stay active daily      Depression Screen PHQ 2/9 Scores 02/10/2018 12/22/2016  PHQ - 2 Score 0 0    Fall Risk Fall Risk  02/10/2018 12/22/2016  Falls in the past year? No No    Cognitive Function: MMSE - Mini Mental State Exam 02/10/2018  Orientation to time 4  Orientation to Place 5  Registration 3  Attention/ Calculation 4  Recall 3  Language- name 2 objects 2  Language- repeat 1  Language- follow 3 step command 1  Language- read & follow direction 1  Write a sentence 1  Copy design 1  Total score 26        Screening Tests Health Maintenance  Topic Date Due  . Hepatitis C Screening  1956-05-12  . HIV Screening  04/23/1973  . COLONOSCOPY  04/23/2008  . TETANUS/TDAP  02/10/2019 (Originally 04/23/1977)  . INFLUENZA VACCINE  Discontinued   Declined colonoscopy and has FOBT at home    Plan:   Keep f/u with PCP Return  FOBT that was given at previous visit Continue to stay physically active. Aim for 150 minutes of moderate activity a week.   I have personally reviewed and noted the following in the patient's chart:   . Medical and social history . Use of alcohol, tobacco or illicit drugs  . Current medications and supplements . Functional ability and status . Nutritional status . Physical activity . Advanced directives . List of other physicians . Hospitalizations, surgeries, and ER visits in previous 12 months . Vitals . Screenings to include cognitive, depression, and falls . Referrals and appointments  In addition, I have reviewed and discussed with patient certain preventive protocols, quality metrics, and best practice recommendations. A written personalized care plan for preventive services as well as general preventive health recommendations were provided to patient.  Demetrios LollKristen Hudy, RN   02/10/18    I have reviewed and agree with the above AWV documentation.   Murtis SinkSam Bradshaw, MD [facility] 03/02/2018, 11:33 AM   I have reviewed and agree with the above AWV documentation.   Murtis SinkSam Bradshaw, MD [facility] 03/02/2018, 2:45 PM

## 2018-02-10 NOTE — Patient Instructions (Signed)
  Mr. Dylan Reid , Thank you for taking time to come for your Medicare Wellness Visit. I appreciate your ongoing commitment to your health goals. Please review the following plan we discussed and let me know if I can assist you in the future.   These are the goals we discussed: Goals    . Exercise 150 min/wk Moderate Activity     Continue to stay active daily       This is a list of the screening recommended for you and due dates:  Health Maintenance  Topic Date Due  .  Hepatitis C: One time screening is recommended by Center for Disease Control  (CDC) for  adults born from [birth year] through 1963.   Jan 01, 1956  . HIV Screening  04/23/1973  . Colon Cancer Screening  04/23/2008  . Tetanus Vaccine  02/10/2019*  . Flu Shot  Discontinued  *Topic was postponed. The date shown is not the original due date.

## 2018-03-15 ENCOUNTER — Ambulatory Visit: Payer: Medicare Other | Admitting: *Deleted

## 2018-03-15 DIAGNOSIS — Z23 Encounter for immunization: Secondary | ICD-10-CM

## 2018-03-15 NOTE — Progress Notes (Signed)
Pt given Tdap vaccine Tolerated well 

## 2019-02-02 ENCOUNTER — Ambulatory Visit (INDEPENDENT_AMBULATORY_CARE_PROVIDER_SITE_OTHER): Payer: Medicare Other | Admitting: Family

## 2019-02-02 ENCOUNTER — Encounter: Payer: Self-pay | Admitting: Family

## 2019-02-02 VITALS — BP 129/78 | HR 75 | Temp 96.7°F | Ht 72.0 in | Wt 200.4 lb

## 2019-02-02 DIAGNOSIS — Z1159 Encounter for screening for other viral diseases: Secondary | ICD-10-CM | POA: Diagnosis not present

## 2019-02-02 DIAGNOSIS — E663 Overweight: Secondary | ICD-10-CM

## 2019-02-02 DIAGNOSIS — Z0001 Encounter for general adult medical examination with abnormal findings: Secondary | ICD-10-CM | POA: Diagnosis not present

## 2019-02-02 DIAGNOSIS — Z0000 Encounter for general adult medical examination without abnormal findings: Secondary | ICD-10-CM | POA: Diagnosis not present

## 2019-02-02 DIAGNOSIS — Z9621 Cochlear implant status: Secondary | ICD-10-CM

## 2019-02-02 NOTE — Patient Instructions (Signed)

## 2019-02-02 NOTE — Progress Notes (Signed)
Subjective:    Patient ID: Dylan Reid, male    DOB: Aug 02, 1956, 63 y.o.   MRN: [id number]  Chief Complaint  Patient presents with  . Annual Exam    HPI PT presents to the office today for CPE. He is currently not taking any medications at this time. Pt denies any headache, palpitations, SOB, or edema at this time.     Review of Systems  All other systems reviewed and are negative.   Family History  Problem Relation Age of Onset  . Diabetes Father   . Healthy Son   . Healthy Son     Social History   Socioeconomic History  . Marital status: Married    Spouse name: Not on file  . Number of children: 2  . Years of education: 35  . Highest education level: 12th grade  Occupational History  . Occupation: Disabled    Comment: Scientist, clinical (histocompatibility and immunogenetics)  . Financial resource strain: Not hard at all  . Food insecurity:    Worry: Never true    Inability: Never true  . Transportation needs:    Medical: No    Non-medical: No  Tobacco Use  . Smoking status: Never Smoker  . Smokeless tobacco: Current User    Types: Snuff  Substance and Sexual Activity  . Alcohol use: No  . Drug use: No  . Sexual activity: Yes  Lifestyle  . Physical activity:    Days per week: 7 days    Minutes per session: 60 min  . Stress: Only a little  Relationships  . Social connections:    Talks on phone: More than three times a week    Gets together: More than three times a week    Attends religious service: More than 4 times per year    Active member of club or organization: Yes    Attends meetings of clubs or organizations: More than 4 times per year    Relationship status: Married  Other Topics Concern  . Not on file  Social History Narrative  . Not on file       Objective:   Physical Exam Vitals signs reviewed.  Constitutional:      General: He is not in acute distress.    Appearance: He is well-developed.  HENT:     Head: Normocephalic.     Right Ear: Tympanic  membrane normal.     Left Ear: Tympanic membrane normal.     Ears:     Comments: cochlear implant  Eyes:     General:        Right eye: No discharge.        Left eye: No discharge.     Pupils: Pupils are equal, round, and reactive to light.  Neck:     Musculoskeletal: Normal range of motion and neck supple.     Thyroid: No thyromegaly.  Cardiovascular:     Rate and Rhythm: Normal rate and regular rhythm.     Heart sounds: Normal heart sounds. No murmur.  Pulmonary:     Effort: Pulmonary effort is normal. No respiratory distress.     Breath sounds: Normal breath sounds. No wheezing.  Abdominal:     General: Bowel sounds are normal. There is no distension.     Palpations: Abdomen is soft.     Tenderness: There is no abdominal tenderness.  Musculoskeletal: Normal range of motion.        General: No tenderness.  Skin:  General: Skin is warm and dry.     Findings: No erythema or rash.  Neurological:     Mental Status: He is alert and oriented to person, place, and time.     Cranial Nerves: No cranial nerve deficit.     Deep Tendon Reflexes: Reflexes are normal and symmetric.  Psychiatric:        Behavior: Behavior normal.        Thought Content: Thought content normal.        Judgment: Judgment normal.       BP 129/78   Pulse 75   Temp (!) 96.7 F (35.9 C) (Oral)   Ht 6' (1.829 m)   Wt 200 lb 6.4 oz (90.9 kg)   BMI 27.18 kg/m      Assessment & Plan:  Dylan Reid comes in today with chief complaint of Annual Exam   Diagnosis and orders addressed:  1. Annual physical exam - CMP14+EGFR - CBC with Differential/Platelet - Lipid panel - PSA, total and free - TSH - Hepatitis C antibody  2. Encounter for hepatitis C screening test for low risk patient - CMP14+EGFR - CBC with Differential/Platelet  3. Cochlear implant in place - CMP14+EGFR - CBC with Differential/Platelet  4. Overweight (BMI 25.0-29.9)   Labs pending Health Maintenance  reviewed Diet and exercise encouraged  Follow up plan: 1 year    Dylan Dun, FNP

## 2019-02-03 ENCOUNTER — Encounter: Payer: Self-pay | Admitting: *Deleted

## 2019-02-03 LAB — CMP14+EGFR
A/G RATIO: 2.7 — AB (ref 1.2–2.2)
ALBUMIN: 4.6 g/dL (ref 3.8–4.9)
ALT: 27 IU/L (ref 0–44)
AST: 23 IU/L (ref 0–40)
Alkaline Phosphatase: 62 IU/L (ref 39–117)
BUN / CREAT RATIO: 22 (ref 10–24)
BUN: 17 mg/dL (ref 8–27)
Bilirubin Total: 0.4 mg/dL (ref 0.0–1.2)
CALCIUM: 9.4 mg/dL (ref 8.6–10.2)
CO2: 24 mmol/L (ref 20–29)
Chloride: 105 mmol/L (ref 96–106)
Creatinine, Ser: 0.77 mg/dL (ref 0.76–1.27)
GFR calc Af Amer: 114 mL/min/{1.73_m2} (ref >59)
GFR, EST NON AFRICAN AMERICAN: 99 mL/min/{1.73_m2} (ref >59)
GLOBULIN, TOTAL: 1.7 g/dL (ref 1.5–4.5)
Glucose: 89 mg/dL (ref 65–99)
POTASSIUM: 4.4 mmol/L (ref 3.5–5.2)
Sodium: 141 mmol/L (ref 134–144)
Total Protein: 6.3 g/dL (ref 6.0–8.5)

## 2019-02-03 LAB — CBC WITH DIFFERENTIAL/PLATELET
BASOS: 1 %
Basophils Absolute: 0.1 10*3/uL (ref 0.0–0.2)
EOS (ABSOLUTE): 0.1 10*3/uL (ref 0.0–0.4)
Eos: 1 %
HEMATOCRIT: 45.7 % (ref 37.5–51.0)
Hemoglobin: 16 g/dL (ref 13.0–17.7)
IMMATURE GRANULOCYTES: 0 %
Immature Grans (Abs): 0 10*3/uL (ref 0.0–0.1)
LYMPHS ABS: 2.3 10*3/uL (ref 0.7–3.1)
Lymphs: 33 %
MCH: 33.2 pg — AB (ref 26.6–33.0)
MCHC: 35 g/dL (ref 31.5–35.7)
MCV: 95 fL (ref 79–97)
MONOS ABS: 0.8 10*3/uL (ref 0.1–0.9)
Monocytes: 11 %
NEUTROS ABS: 3.7 10*3/uL (ref 1.4–7.0)
NEUTROS PCT: 54 %
PLATELETS: 178 10*3/uL (ref 150–450)
RBC: 4.82 x10E6/uL (ref 4.14–5.80)
RDW: 12.6 % (ref 11.6–15.4)
WBC: 6.9 10*3/uL (ref 3.4–10.8)

## 2019-02-03 LAB — PSA, TOTAL AND FREE
PROSTATE SPECIFIC AG, SERUM: 0.6 ng/mL (ref 0.0–4.0)
PSA FREE PCT: 31.7 %
PSA, Free: 0.19 ng/mL

## 2019-02-03 LAB — LIPID PANEL
CHOL/HDL RATIO: 4.8 ratio (ref 0.0–5.0)
Cholesterol, Total: 172 mg/dL (ref 100–199)
HDL: 36 mg/dL — ABNORMAL LOW (ref >39)
LDL Calculated: 123 mg/dL — ABNORMAL HIGH (ref 0–99)
Triglycerides: 66 mg/dL (ref 0–149)
VLDL Cholesterol Cal: 13 mg/dL (ref 5–40)

## 2019-02-03 LAB — HEPATITIS C ANTIBODY

## 2019-02-03 LAB — TSH: TSH: 3.3 u[IU]/mL (ref 0.450–4.500)

## 2019-05-04 ENCOUNTER — Telehealth: Payer: Self-pay | Admitting: Family

## 2021-11-19 ENCOUNTER — Telehealth: Payer: Self-pay | Admitting: Family

## 2022-01-02 DIAGNOSIS — H903 Sensorineural hearing loss, bilateral: Secondary | ICD-10-CM | POA: Diagnosis not present

## 2022-03-06 DIAGNOSIS — H903 Sensorineural hearing loss, bilateral: Secondary | ICD-10-CM | POA: Diagnosis not present

## 2022-04-09 DIAGNOSIS — H903 Sensorineural hearing loss, bilateral: Secondary | ICD-10-CM | POA: Diagnosis not present

## 2023-04-07 DIAGNOSIS — H903 Sensorineural hearing loss, bilateral: Secondary | ICD-10-CM | POA: Diagnosis not present

## 2023-04-27 DIAGNOSIS — H2513 Age-related nuclear cataract, bilateral: Secondary | ICD-10-CM | POA: Diagnosis not present

## 2023-07-27 ENCOUNTER — Encounter: Payer: Self-pay | Admitting: Family Medicine

## 2023-07-27 ENCOUNTER — Ambulatory Visit (INDEPENDENT_AMBULATORY_CARE_PROVIDER_SITE_OTHER): Payer: Medicare Other | Admitting: Family Medicine

## 2023-07-27 VITALS — BP 136/80 | HR 67 | Temp 98.5°F | Ht 72.0 in | Wt 185.0 lb

## 2023-07-27 DIAGNOSIS — Z7689 Persons encountering health services in other specified circumstances: Secondary | ICD-10-CM

## 2023-07-27 DIAGNOSIS — Z0001 Encounter for general adult medical examination with abnormal findings: Secondary | ICD-10-CM

## 2023-07-27 DIAGNOSIS — Z0000 Encounter for general adult medical examination without abnormal findings: Secondary | ICD-10-CM

## 2023-07-27 DIAGNOSIS — Z136 Encounter for screening for cardiovascular disorders: Secondary | ICD-10-CM | POA: Diagnosis not present

## 2023-07-27 DIAGNOSIS — R7303 Prediabetes: Secondary | ICD-10-CM | POA: Diagnosis not present

## 2023-07-27 DIAGNOSIS — Z8679 Personal history of other diseases of the circulatory system: Secondary | ICD-10-CM

## 2023-07-27 LAB — CMP14+EGFR

## 2023-07-27 LAB — LIPID PANEL

## 2023-07-27 LAB — CBC WITH DIFFERENTIAL/PLATELET
Basophils Absolute: 0.1 10*3/uL (ref 0.0–0.2)
Basos: 1 %
EOS (ABSOLUTE): 0.1 10*3/uL (ref 0.0–0.4)
Eos: 1 %
Hematocrit: 48.1 % (ref 37.5–51.0)
Hemoglobin: 16.7 g/dL (ref 13.0–17.7)
Immature Grans (Abs): 0 10*3/uL (ref 0.0–0.1)
Immature Granulocytes: 0 %
Lymphocytes Absolute: 1.8 10*3/uL (ref 0.7–3.1)
Lymphs: 27 %
MCH: 33.7 pg — ABNORMAL HIGH (ref 26.6–33.0)
MCHC: 34.7 g/dL (ref 31.5–35.7)
MCV: 97 fL (ref 79–97)
Monocytes Absolute: 0.7 10*3/uL (ref 0.1–0.9)
Monocytes: 10 %
Neutrophils Absolute: 4.1 10*3/uL (ref 1.4–7.0)
Neutrophils: 61 %
Platelets: 157 10*3/uL (ref 150–450)
RBC: 4.95 x10E6/uL (ref 4.14–5.80)
RDW: 12.3 % (ref 11.6–15.4)
WBC: 6.7 10*3/uL (ref 3.4–10.8)

## 2023-07-27 LAB — TSH

## 2023-07-27 LAB — BAYER DCA HB A1C WAIVED: HB A1C (BAYER DCA - WAIVED): 5.7 % — ABNORMAL HIGH (ref 4.8–5.6)

## 2023-07-27 LAB — VITAMIN D 25 HYDROXY (VIT D DEFICIENCY, FRACTURES)

## 2023-07-27 NOTE — Patient Instructions (Signed)

## 2023-07-27 NOTE — Progress Notes (Signed)
New Patient Office Visit  Subjective   Patient ID: Dylan Reid, male    DOB: 1956-11-28  Age: 67 y.o. MRN: [id number]  CC:  Chief Complaint  Patient presents with   New Patient (Initial Visit)    Establish care physical    HPI Dylan Reid presents to establish care  Patient had [facility] in 2017 without follow up with neurology and cardiology. Reports that he only followed up with PCP at the time.  Has cochlear ear implant   Blood pressure  Checks his BP  BP at home in am 118/70   Occupation: Retired Marital status: married  Diet: sometimes skips meals. Fruits, juice,  Sausage biscutis on occasion Garden  Exercise: farmer  Substance use: dips tobacco on occasion 1 can x 2 months  ETOH on occasion  6-8 oz per week  Last eye exam: UTD  Last dental exam: UTD Last colonoscopy: declines at this time PSA: denies history of prostate cancer  Refills needed today: none  Other specialists seen: none  Dermatology exam: none Fasting today:  no  Immunizations needed: Flu Vaccine: no  Tdap Vaccine: no  - every 34yrs - (<3 lifetime doses or unknown): all wounds -- look up need for Tetanus IG - (>=3 lifetime doses): clean/minor wound if >65yrs from previous; all other wounds if >32yrs from previous Zoster Vaccine: no (those >50yo, once) Pneumonia Vaccine: no (those w/ risk factors) - (<65yr) Both: Immunocompromised, cochlear implant, CSF leak, asplenic, sickle cell, Chronic Renal Failure - (<65yr) PPSV-23 only: Heart dz, lung disease, DM, tobacco abuse, alcoholism, cirrhosis/liver disease. - (>61yr): PPSV13 then PPSV23 in 6-12mths;  - (>72yr): repeat PPSV23 once if pt received prior to 65yo and 28yrs have passed  No outpatient encounter medications on file as of 07/27/2023.   No facility-administered encounter medications on file as of 07/27/2023.    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   History of subarachnoid hemorrhage 2017     Past Surgical History:  Procedure Laterality Date   COCHLEAR IMPLANT Right     Family History  Problem Relation Age of Onset   Diabetes Father    Healthy Son    Healthy Son     Social History   Socioeconomic History   Marital status: Married    Spouse name: Not on file   Number of children: 2   Years of education: 12   Highest education level: 12th grade  Occupational History   Occupation: Disabled    Comment: Dentist  Tobacco Use   Smoking status: Never   Smokeless tobacco: Current    Types: Snuff  Vaping Use   Vaping status: Never Used  Substance and Sexual Activity   Alcohol use: No   Drug use: No   Sexual activity: Yes  Other Topics Concern   Not on file  Social History Narrative   Not on file   Social Determinants of Health   Financial Resource Strain: Low Risk  (02/10/2018)   Overall Financial Resource Strain (CARDIA)    Difficulty of Paying Living Expenses: Not hard at all  Food Insecurity: No Food Insecurity (02/10/2018)   Hunger Vital Sign    Worried About Running Out of Food in the Last Year: Never true    Ran Out of Food in the Last Year: Never true  Transportation Needs: No Transportation Needs (02/10/2018)   PRAPARE - Transportation    Lack of Transportation (Medical): No    Lack  of Transportation (Non-Medical): No  Physical Activity: Sufficiently Active (02/10/2018)   Exercise Vital Sign    Days of Exercise per Week: 7 days    Minutes of Exercise per Session: 60 min  Stress: No Stress Concern Present (02/10/2018)   [employer] of Occupational Health - Occupational Stress Questionnaire    Feeling of Stress : Only a little  Social Connections: Socially Integrated (02/10/2018)   Social Connection and Isolation Panel [NHANES]    Frequency of Communication with Friends and Family: More than three times a week    Frequency of Social Gatherings with Friends and Family: More than three times a week    Attends Religious Services: More  than 4 times per year    Active Member of [employer] or Organizations: Yes    Attends Engineer, structural: More than 4 times per year    Marital Status: Married  Catering manager Violence: Not At Risk (02/10/2018)   Humiliation, Afraid, Rape, and Kick questionnaire    Fear of Current or Ex-Partner: No    Emotionally Abused: No    Physically Abused: No    Sexually Abused: No    ROS As per HPI   Objective   BP 136/80   Pulse 67   Temp 98.5 F (36.9 C)   Ht 6' (1.829 m)   Wt 185 lb (83.9 kg)   SpO2 97%   BMI 25.09 kg/m   Physical Exam Constitutional:      General: He is not in acute distress.    Appearance: Normal appearance. He is not ill-appearing, toxic-appearing or diaphoretic.  HENT:     Right Ear: Ear canal and external ear normal.     Left Ear: Ear canal and external ear normal.     Ears:     Comments: Cochlear implant device in right ear     Nose: Nose normal.     Mouth/Throat:     Mouth: Mucous membranes are moist.  Eyes:     Extraocular Movements: Extraocular movements intact.     Conjunctiva/sclera: Conjunctivae normal.     Pupils: Pupils are equal, round, and reactive to light. Pupils are equal.  Neck:     Thyroid: No thyroid mass, thyromegaly or thyroid tenderness.  Cardiovascular:     Rate and Rhythm: Normal rate and regular rhythm.     Pulses: Normal pulses.     Heart sounds: Normal heart sounds. No murmur heard.    No friction rub. No gallop.  Pulmonary:     Effort: Pulmonary effort is normal. No respiratory distress.     Breath sounds: Normal breath sounds. No stridor. No wheezing, rhonchi or rales.  Chest:     Chest wall: No tenderness.  Abdominal:     General: Abdomen is flat. Bowel sounds are normal. There is no distension.     Palpations: Abdomen is soft. There is no mass.     Tenderness: There is no abdominal tenderness. There is no guarding or rebound.     Hernia: No hernia is present.  Musculoskeletal:        General: Normal range  of motion.  Skin:    General: Skin is warm.     Capillary Refill: Capillary refill takes less than 2 seconds.  Neurological:     General: No focal deficit present.     Mental Status: He is alert and oriented to person, place, and time. Mental status is at baseline.     Cranial Nerves: No cranial nerve deficit.  Motor: No weakness.     Coordination: Coordination normal.  Psychiatric:        Attention and Perception: Attention and perception normal.        Mood and Affect: Mood and affect normal.        Speech: Speech normal.        Behavior: Behavior normal. Behavior is cooperative.        Thought Content: Thought content normal.        Cognition and Memory: Cognition and memory normal.        Judgment: Judgment normal.       07/27/2023    9:20 AM 02/02/2019   12:04 PM 02/10/2018    9:18 AM  Depression screen PHQ 2/9  Decreased Interest 0 0 0  Down, Depressed, Hopeless 0 0 0  PHQ - 2 Score 0 0 0  Altered sleeping 0    Tired, decreased energy 1    Change in appetite 1    Feeling bad or failure about yourself  0    Trouble concentrating 1    Suicidal thoughts 0    PHQ-9 Score 3    Difficult doing work/chores Not difficult at all        07/27/2023    9:20 AM  GAD 7 : Generalized Anxiety Score  Nervous, Anxious, on Edge 0  Control/stop worrying 0  Worry too much - different things 1  Trouble relaxing 0  Restless 0  Easily annoyed or irritable 0  Afraid - awful might happen 1  Total GAD 7 Score 2  Anxiety Difficulty Not difficult at all   Assessment & Plan:  Lukas was seen today for new patient (initial visit).  Diagnoses and all orders for this visit:  Encounter to establish care Discussed with patient to continue healthy lifestyle choices, including diet (rich in fruits, vegetables, and lean proteins, and low in salt and simple carbohydrates) and exercise (at least 30 minutes of moderate physical activity daily). Limit beverages high is sugar. Recommended at least  80-100 oz of water daily.  Patient declined multiple health maintenance items  Patient provided Cologuard handout to review at home and make decision on completing.   Encounter for routine adult health examination without abnormal findings As above   Prediabetes Labs as below. Will communicate results to patient once available. Will await results to determine next steps.  -     CBC with Differential/Platelet -     CMP14+EGFR -     Lipid panel -     TSH -     PR PSA SCREENING -     Bayer DCA Hb A1c Waived -     VITAMIN D 25 Hydroxy (Vit-D Deficiency, Fractures)  History of subarachnoid hemorrhage Did not follow up with neurology or neurosurgery. Discussed with patient managing risks. Patient aware of signs and symptoms to monitor.    The above assessment and management plan was discussed with the patient. The patient verbalized understanding of and has agreed to the management plan using shared-decision making. Patient is aware to call the clinic if they develop any new symptoms or if symptoms fail to improve or worsen. Patient is aware when to return to the clinic for a follow-up visit. Patient educated on when it is appropriate to go to the emergency department.   Return in about 3 months (around 10/27/2023) for Chronic Condition Follow up.   Neale Burly, DNP-FNP [facility] [address] [place], [place] [address] [phone]

## 2023-07-28 NOTE — Progress Notes (Signed)
CBC unremarkable. Vitamin D slightly low. Recommend [phone] international units daily  Patient is in the Prediabetes range and Cholesterol is elevated. Diet encouraged - increase intake of fresh fruits and vegetables, increase intake of lean proteins. Bake, broil, or grill foods. Avoid fried, greasy, and fatty foods. Avoid fast foods. Increase intake of fiber-rich whole grains. Exercise encouraged - at least 150 minutes per week and advance as tolerated. Can also try red yeast rice and we will recheck in 3 months.   Given that ASCVD risk is high, would like for patient to consider taking a statin for prevention of cardiac events.   The 10-year ASCVD risk score (Arnett DK, et al., 2019) is: 14.6%   Values used to calculate the score:     Age: 67 years     Sex: Male     Is Non-Hispanic African American: No     Diabetic: No     Tobacco smoker: No     Systolic Blood Pressure: 136 mmHg     Is BP treated: No     HDL Cholesterol: 36 mg/dL     Total Cholesterol: 162 mg/dL

## 2023-10-27 ENCOUNTER — Ambulatory Visit: Payer: Medicare Other | Admitting: Family Medicine

## 2023-10-27 ENCOUNTER — Encounter: Payer: Self-pay | Admitting: Family Medicine

## 2023-10-27 VITALS — BP 133/81 | HR 72 | Temp 98.2°F | Ht 70.5 in | Wt 182.0 lb

## 2023-10-27 DIAGNOSIS — Z720 Tobacco use: Secondary | ICD-10-CM | POA: Diagnosis not present

## 2023-10-27 DIAGNOSIS — R7303 Prediabetes: Secondary | ICD-10-CM | POA: Diagnosis not present

## 2023-10-27 DIAGNOSIS — E559 Vitamin D deficiency, unspecified: Secondary | ICD-10-CM | POA: Diagnosis not present

## 2023-10-27 DIAGNOSIS — Z8679 Personal history of other diseases of the circulatory system: Secondary | ICD-10-CM | POA: Diagnosis not present

## 2023-10-27 DIAGNOSIS — E782 Mixed hyperlipidemia: Secondary | ICD-10-CM | POA: Insufficient documentation

## 2023-10-27 LAB — BAYER DCA HB A1C WAIVED: HB A1C (BAYER DCA - WAIVED): 5.7 % — ABNORMAL HIGH (ref 4.8–5.6)

## 2023-10-27 NOTE — Progress Notes (Signed)
Subjective:  Patient ID: Dylan Reid, male    DOB: 11-Aug-1956, 67 y.o.   MRN: [id number]  Patient Care Team: Arrie Senate, FNP as PCP - General (Family Medicine)   Chief Complaint:  Medical Management of Chronic Issues   HPI: Dylan Reid is a 67 y.o. male presenting on 10/27/2023 for Medical Management of Chronic Issues  HPI 1. Prediabetes States that he has cut out soft drinks.  Rarely eats cheeseburgers and hotdogs.  States that he does eat BLTs.   2. Mixed hyperlipidemia Lipid/Cholesterol, Follow-up  Last lipid panel Other pertinent labs  Lab Results  Component Value Date   CHOL 162 07/27/2023   HDL 36 (L) 07/27/2023   LDLCALC 109 (H) 07/27/2023   TRIG 91 07/27/2023   CHOLHDL 4.5 07/27/2023   Lab Results  Component Value Date   ALT 16 07/27/2023   AST 15 07/27/2023   PLT 157 07/27/2023   TSH 2.230 07/27/2023     He was last seen for this 3 months ago.  Management since that visit includes none  Symptoms: No chest pain No chest pressure/discomfort  No dyspnea No lower extremity edema  No numbness or tingling of extremity No orthopnea  No palpitations No paroxysmal nocturnal dyspnea  No speech difficulty No syncope   Current diet: well balanced Current exercise: gardening and yard work  The 10-year ASCVD risk score (Arnett DK, et al., 2019) is: 14.1%  ---------------------------------------------------------------------------------------------------   3. Vitamin D deficiency States that he is taking vitamin d daily. Denies any symptoms  4. Elevated BP without Hypertension diagnosis  Has monitor at home. Has been checking 2-3 days per week. Brought in BP log.  Consistently 120/80s. Max 131/80s ROS Denies anxiety, fatigue, peripheral edema, changes to vision, chest pain, headaches, palpitations, sweats, SOB, PND, orthopnea Meds none CAD risks tobacco use   5. Tobacco Use  Dips tobacco, 1 can per month.  Has not smoked since  teenager   Relevant past medical, surgical, family, and social history reviewed and updated as indicated.  Allergies and medications reviewed and updated. Data reviewed: Chart in Epic.   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   History of subarachnoid hemorrhage 2017   Past Surgical History:  Procedure Laterality Date   COCHLEAR IMPLANT Right     Social History   Socioeconomic History   Marital status: Married    Spouse name: Not on file   Number of children: 2   Years of education: 12   Highest education level: 12th grade  Occupational History   Occupation: Disabled    Comment: Dentist  Tobacco Use   Smoking status: Never   Smokeless tobacco: Current    Types: Snuff  Vaping Use   Vaping status: Never Used  Substance and Sexual Activity   Alcohol use: No   Drug use: No   Sexual activity: Yes  Other Topics Concern   Not on file  Social History Narrative   Not on file   Social Determinants of Health   Financial Resource Strain: Low Risk  (02/10/2018)   Overall Financial Resource Strain (CARDIA)    Difficulty of Paying Living Expenses: Not hard at all  Food Insecurity: No Food Insecurity (02/10/2018)   Hunger Vital Sign    Worried About Running Out of Food in the Last Year: Never true    Ran Out of Food in the Last Year: Never true  Transportation Needs: No Transportation  Needs (02/10/2018)   PRAPARE - Administrator, Civil Service (Medical): No    Lack of Transportation (Non-Medical): No  Physical Activity: Sufficiently Active (02/10/2018)   Exercise Vital Sign    Days of Exercise per Week: 7 days    Minutes of Exercise per Session: 60 min  Stress: No Stress Concern Present (02/10/2018)   [employer] of Occupational Health - Occupational Stress Questionnaire    Feeling of Stress : Only a little  Social Connections: Socially Integrated (02/10/2018)   Social Connection and Isolation Panel [NHANES]     Frequency of Communication with Friends and Family: More than three times a week    Frequency of Social Gatherings with Friends and Family: More than three times a week    Attends Religious Services: More than 4 times per year    Active Member of [employer] or Organizations: Yes    Attends Engineer, structural: More than 4 times per year    Marital Status: Married  Catering manager Violence: Not At Risk (02/10/2018)   Humiliation, Afraid, Rape, and Kick questionnaire    Fear of Current or Ex-Partner: No    Emotionally Abused: No    Physically Abused: No    Sexually Abused: No    Outpatient Encounter Medications as of 10/27/2023  Medication Sig   VITAMIN D PO Take by mouth.   No facility-administered encounter medications on file as of 10/27/2023.    No Known Allergies  Review of Systems As per HPI  Objective:  BP 133/81   Pulse 72   Temp 98.2 F (36.8 C)   Ht 5' 10.5" (1.791 m)   Wt 182 lb (82.6 kg)   SpO2 98%   BMI 25.75 kg/m    Wt Readings from Last 3 Encounters:  10/27/23 182 lb (82.6 kg)  07/27/23 185 lb (83.9 kg)  02/02/19 200 lb 6.4 oz (90.9 kg)   Physical Exam Constitutional:      General: He is awake. He is not in acute distress.    Appearance: Normal appearance. He is well-developed and well-groomed. He is not ill-appearing, toxic-appearing or diaphoretic.  HENT:     Head:     Comments: Cochlear implant device in place on right ear Cardiovascular:     Rate and Rhythm: Normal rate and regular rhythm.     Pulses: Normal pulses.          Radial pulses are 2+ on the right side and 2+ on the left side.       Posterior tibial pulses are 2+ on the right side and 2+ on the left side.     Heart sounds: Normal heart sounds. No murmur heard.    No gallop.  Pulmonary:     Effort: Pulmonary effort is normal. No respiratory distress.     Breath sounds: Normal breath sounds. No stridor. No wheezing, rhonchi or rales.  Musculoskeletal:     Cervical back: Full  passive range of motion without pain and neck supple.     Right lower leg: No edema.     Left lower leg: No edema.  Skin:    General: Skin is warm.     Capillary Refill: Capillary refill takes less than 2 seconds.  Neurological:     General: No focal deficit present.     Mental Status: He is alert, oriented to person, place, and time and easily aroused. Mental status is at baseline.     GCS: GCS eye subscore is 4. GCS  verbal subscore is 5. GCS motor subscore is 6.     Motor: No weakness.  Psychiatric:        Attention and Perception: Attention and perception normal.        Mood and Affect: Mood and affect normal.        Speech: Speech normal.        Behavior: Behavior normal. Behavior is cooperative.        Thought Content: Thought content normal. Thought content does not include homicidal or suicidal ideation. Thought content does not include homicidal or suicidal plan.        Cognition and Memory: Cognition and memory normal.        Judgment: Judgment normal.      Results for orders placed or performed in visit on 07/27/23  CBC with Differential/Platelet  Result Value Ref Range   WBC 6.7 3.4 - 10.8 x10E3/uL   RBC 4.95 4.14 - 5.80 x10E6/uL   Hemoglobin 16.7 13.0 - 17.7 g/dL   Hematocrit 16.1 09.6 - 51.0 %   MCV 97 79 - 97 fL   MCH 33.7 (H) 26.6 - 33.0 pg   MCHC 34.7 31.5 - 35.7 g/dL   RDW 04.5 40.9 - 81.1 %   Platelets 157 150 - 450 x10E3/uL   Neutrophils 61 Not Estab. %   Lymphs 27 Not Estab. %   Monocytes 10 Not Estab. %   Eos 1 Not Estab. %   Basos 1 Not Estab. %   Neutrophils Absolute 4.1 1.4 - 7.0 x10E3/uL   Lymphocytes Absolute 1.8 0.7 - 3.1 x10E3/uL   Monocytes Absolute 0.7 0.1 - 0.9 x10E3/uL   EOS (ABSOLUTE) 0.1 0.0 - 0.4 x10E3/uL   Basophils Absolute 0.1 0.0 - 0.2 x10E3/uL   Immature Granulocytes 0 Not Estab. %   Immature Grans (Abs) 0.0 0.0 - 0.1 x10E3/uL  CMP14+EGFR  Result Value Ref Range   Glucose 93 70 - 99 mg/dL   BUN 18 8 - 27 mg/dL   Creatinine,  Ser 9.14 0.76 - 1.27 mg/dL   eGFR 96 >78 GN/FAO/1.30   BUN/Creatinine Ratio 21 10 - 24   Sodium 141 134 - 144 mmol/L   Potassium 4.6 3.5 - 5.2 mmol/L   Chloride 105 96 - 106 mmol/L   CO2 24 20 - 29 mmol/L   Calcium 9.1 8.6 - 10.2 mg/dL   Total Protein 6.5 6.0 - 8.5 g/dL   Albumin 4.5 3.9 - 4.9 g/dL   Globulin, Total 2.0 1.5 - 4.5 g/dL   Bilirubin Total 0.5 0.0 - 1.2 mg/dL   Alkaline Phosphatase 63 44 - 121 IU/L   AST 15 0 - 40 IU/L   ALT 16 0 - 44 IU/L  Lipid panel  Result Value Ref Range   Cholesterol, Total 162 100 - 199 mg/dL   Triglycerides 91 0 - 149 mg/dL   HDL 36 (L) >86 mg/dL   VLDL Cholesterol Cal 17 5 - 40 mg/dL   LDL Chol Calc (NIH) 578 (H) 0 - 99 mg/dL   Chol/HDL Ratio 4.5 0.0 - 5.0 ratio  TSH  Result Value Ref Range   TSH 2.230 0.450 - 4.500 uIU/mL  Bayer DCA Hb A1c Waived  Result Value Ref Range   HB A1C (BAYER DCA - WAIVED) 5.7 (H) 4.8 - 5.6 %  VITAMIN D 25 Hydroxy (Vit-D Deficiency, Fractures)  Result Value Ref Range   Vit D, 25-Hydroxy 27.4 (L) 30.0 - 100.0 ng/mL       10/27/2023  9:18 AM 07/27/2023    9:20 AM 02/02/2019   12:04 PM 02/10/2018    9:18 AM 12/22/2016    1:31 PM  Depression screen PHQ 2/9  Decreased Interest 0 0 0 0 0  Down, Depressed, Hopeless 0 0 0 0 0  PHQ - 2 Score 0 0 0 0 0  Altered sleeping 2 0     Tired, decreased energy 0 1     Change in appetite 0 1     Feeling bad or failure about yourself  0 0     Trouble concentrating 0 1     Moving slowly or fidgety/restless 0      Suicidal thoughts 0 0     PHQ-9 Score 2 3     Difficult doing work/chores Not difficult at all Not difficult at all          10/27/2023    9:18 AM 07/27/2023    9:20 AM  GAD 7 : Generalized Anxiety Score  Nervous, Anxious, on Edge 0 0  Control/stop worrying 0 0  Worry too much - different things 0 1  Trouble relaxing 0 0  Restless 0 0  Easily annoyed or irritable 0 0  Afraid - awful might happen 0 1  Total GAD 7 Score 0 2  Anxiety Difficulty Not  difficult at all Not difficult at all   Pertinent labs & imaging results that were available during my care of the patient were reviewed by me and considered in my medical decision making.  Assessment & Plan:  Ronnal was seen today for medical management of chronic issues.  Diagnoses and all orders for this visit:  Prediabetes Reviewed A1C in office. Well controlled. Discussed with patient to continue healthy lifestyle choices, including diet (rich in fruits, vegetables, and lean proteins, and low in salt and simple carbohydrates) and exercise (at least 30 minutes of moderate physical activity daily). Limit beverages high is sugar. Recommended at least 80-100 oz of water daily.  Labs as below. Will communicate results to patient once available. Will await results to determine next steps.  -     Bayer DCA Hb A1c Waived -     CBC with Differential/Platelet -     CMP14+EGFR  Mixed hyperlipidemia Labs as below. Will communicate results to patient once available. Will await results to determine next steps.  Fasting unknown.  Discussed with patient ASVCD risk score. Patient does not wish to start statin therapy at this time. Encouraged patient to start RYR.  -     Lipid panel  Vitamin D deficiency Labs as below. Will communicate results to patient once available. Will await results to determine next steps.  -     VITAMIN D 25 Hydroxy (Vit-D Deficiency, Fractures)  History of subarachnoid hemorrhage Reviewed Admission notes from 11/10/2016. Patient was to follow up with neurosurgery post admission and was lost to follow up. Denies symptoms. Declines referral at this time. Patient to notify provider if he would like to follow up. Reviewed red flag symptoms with patient.   Tobacco use Does not wish to discontinue at this time. Has significantly reduced intake. Will continue to assess readiness.   Continue all other maintenance medications.  Follow up plan: Return in about 6 months (around  04/25/2024) for Chronic Condition Follow up.  Continue healthy lifestyle choices, including diet (rich in fruits, vegetables, and lean proteins, and low in salt and simple carbohydrates) and exercise (at least 30 minutes of moderate physical activity daily).  Written and  verbal instructions provided   The above assessment and management plan was discussed with the patient. The patient verbalized understanding of and has agreed to the management plan. Patient is aware to call the clinic if they develop any new symptoms or if symptoms persist or worsen. Patient is aware when to return to the clinic for a follow-up visit. Patient educated on when it is appropriate to go to the emergency department.   Neale Burly, DNP-FNP [facility] [address] [place], [place] [address] [phone]

## 2023-10-28 LAB — CMP14+EGFR
ALT: 17 IU/L (ref 0–44)
AST: 18 [IU]/L (ref 0–40)
Albumin: 4.4 g/dL (ref 3.9–4.9)
Alkaline Phosphatase: 68 [IU]/L (ref 44–121)
BUN/Creatinine Ratio: 25 — ABNORMAL HIGH (ref 10–24)
BUN: 22 mg/dL (ref 8–27)
Bilirubin Total: 0.7 mg/dL (ref 0.0–1.2)
CO2: 23 mmol/L (ref 20–29)
Calcium: 9.2 mg/dL (ref 8.6–10.2)
Chloride: 105 mmol/L (ref 96–106)
Creatinine, Ser: 0.89 mg/dL (ref 0.76–1.27)
Globulin, Total: 2 g/dL (ref 1.5–4.5)
Glucose: 92 mg/dL (ref 70–99)
Potassium: 4.9 mmol/L (ref 3.5–5.2)
Sodium: 142 mmol/L (ref 134–144)
Total Protein: 6.4 g/dL (ref 6.0–8.5)
eGFR: 95 mL/min/{1.73_m2} (ref >59)

## 2023-10-28 LAB — CBC WITH DIFFERENTIAL/PLATELET
Basophils Absolute: 0.1 10*3/uL (ref 0.0–0.2)
Basos: 1 %
EOS (ABSOLUTE): 0.1 10*3/uL (ref 0.0–0.4)
Eos: 2 %
Hematocrit: 50.5 % (ref 37.5–51.0)
Hemoglobin: 17.2 g/dL (ref 13.0–17.7)
Immature Grans (Abs): 0 10*3/uL (ref 0.0–0.1)
Immature Granulocytes: 0 %
Lymphocytes Absolute: 1.6 10*3/uL (ref 0.7–3.1)
Lymphs: 27 %
MCH: 33.5 pg — ABNORMAL HIGH (ref 26.6–33.0)
MCHC: 34.1 g/dL (ref 31.5–35.7)
MCV: 98 fL — ABNORMAL HIGH (ref 79–97)
Monocytes Absolute: 0.6 10*3/uL (ref 0.1–0.9)
Monocytes: 10 %
Neutrophils Absolute: 3.4 10*3/uL (ref 1.4–7.0)
Neutrophils: 60 %
Platelets: 157 10*3/uL (ref 150–450)
RBC: 5.14 x10E6/uL (ref 4.14–5.80)
RDW: 12.2 % (ref 11.6–15.4)
WBC: 5.7 10*3/uL (ref 3.4–10.8)

## 2023-10-28 LAB — LIPID PANEL
Chol/HDL Ratio: 4.8 ratio (ref 0.0–5.0)
Cholesterol, Total: 171 mg/dL (ref 100–199)
HDL: 36 mg/dL — ABNORMAL LOW (ref >39)
LDL Chol Calc (NIH): 119 mg/dL — ABNORMAL HIGH (ref 0–99)
Triglycerides: 84 mg/dL (ref 0–149)
VLDL Cholesterol Cal: 16 mg/dL (ref 5–40)

## 2023-10-28 LAB — VITAMIN D 25 HYDROXY (VIT D DEFICIENCY, FRACTURES): Vit D, 25-Hydroxy: 34.3 ng/mL (ref 30.0–100.0)

## 2023-10-28 NOTE — Progress Notes (Signed)
Slightly elevated MCH, MCV and BUN/Crt, not concerning at this time. Slightly elevated "bad" cholesterol and slightly decreased "good" cholesterol. Recommend increase intake of fresh fruits and vegetables, increase intake of lean proteins. Bake, broil, or grill foods. Avoid fried, greasy, and fatty foods. Avoid fast foods. Based on ASCVD risk score, recommend starting crestor to prevent Cardiac event in the next 10 years. If patient is not willing to start statin, recommend RYR. A1C stable in prediabetes range. Vitamin D normal.   The 10-year ASCVD risk score (Arnett DK, et al., 2019) is: 14.7%   Values used to calculate the score:     Age: 67 years     Sex: Male     Is Non-Hispanic African American: No     Diabetic: No     Tobacco smoker: No     Systolic Blood Pressure: 133 mmHg     Is BP treated: No     HDL Cholesterol: 36 mg/dL     Total Cholesterol: 171 mg/dL

## 2024-04-25 ENCOUNTER — Ambulatory Visit: Payer: Medicare Other | Admitting: Family Medicine

## 2024-05-11 ENCOUNTER — Ambulatory Visit (INDEPENDENT_AMBULATORY_CARE_PROVIDER_SITE_OTHER): Admitting: Family Medicine

## 2024-05-11 ENCOUNTER — Ambulatory Visit: Payer: Self-pay | Admitting: Family Medicine

## 2024-05-11 ENCOUNTER — Encounter: Payer: Self-pay | Admitting: Family Medicine

## 2024-05-11 VITALS — BP 119/74 | HR 67 | Temp 97.7°F | Ht 70.5 in | Wt 182.4 lb

## 2024-05-11 DIAGNOSIS — E559 Vitamin D deficiency, unspecified: Secondary | ICD-10-CM

## 2024-05-11 DIAGNOSIS — E663 Overweight: Secondary | ICD-10-CM | POA: Diagnosis not present

## 2024-05-11 DIAGNOSIS — R7303 Prediabetes: Secondary | ICD-10-CM | POA: Diagnosis not present

## 2024-05-11 DIAGNOSIS — R351 Nocturia: Secondary | ICD-10-CM

## 2024-05-11 DIAGNOSIS — E782 Mixed hyperlipidemia: Secondary | ICD-10-CM

## 2024-05-11 LAB — BAYER DCA HB A1C WAIVED: HB A1C (BAYER DCA - WAIVED): 5.4 % (ref 4.8–5.6)

## 2024-05-11 NOTE — Progress Notes (Signed)
 Subjective:  Patient ID: Dylan Reid, male    DOB: April 04, 1956, 68 y.o.   MRN: [id number]  Patient Care Team: Chrystine Crate, FNP as PCP - General (Family Medicine)   Chief Complaint:  Medical Management of Chronic Issues (6 month)  HPI: Dylan Reid is a 68 y.o. male presenting on 05/11/2024 for Medical Management of Chronic Issues (6 month)   HPI Mixed hyperlipidemia Lipid/Cholesterol, Follow-up  Last lipid panel Other pertinent labs  Lab Results  Component Value Date   CHOL 171 10/27/2023   HDL 36 (L) 10/27/2023   LDLCALC 119 (H) 10/27/2023   TRIG 84 10/27/2023   CHOLHDL 4.8 10/27/2023   Lab Results  Component Value Date   ALT 17 10/27/2023   AST 18 10/27/2023   PLT 157 10/27/2023   TSH 2.230 07/27/2023     He was last seen for this 6 months ago.  Management since that visit includes RYR .  He reports excellent compliance with treatment. He is not having side effects.   Symptoms: No chest pain No chest pressure/discomfort  No dyspnea No lower extremity edema  No numbness or tingling of extremity No orthopnea  No palpitations No paroxysmal nocturnal dyspnea  No speech difficulty No syncope   No changes to diet or exercise.   The 10-year ASCVD risk score (Arnett DK, et al., 2019) is: 13.1% Coffee with honey this morning --------------------------------------------------------------------------------------------------- Prediabetes He is not making changes to diet or exercise.   Vitamin D  deficiency Vitamin D  deficiency, follow-up  Lab Results  Component Value Date   VD25OH 34.3 10/27/2023   VD25OH 27.4 (L) 07/27/2023   CALCIUM 9.2 10/27/2023   CALCIUM 9.1 07/27/2023        Wt Readings from Last 3 Encounters:  05/11/24 182 lb 6.4 oz (82.7 kg)  10/27/23 182 lb (82.6 kg)  07/27/23 185 lb (83.9 kg)    He was last seen for vitamin D  deficiency 6 months ago.  Management since that visit includes 1000 international units . He reports  excellent compliance with treatment. He is not having side effects.   Symptoms: No change in energy level No numbness or tingling  No bone pain No unexplained fracture   ---------------------------------------------------------------------------------------------------  Nocturia  States that he goes at least once. Some nights he goes multiple times per night.   Relevant past medical, surgical, family, and social history reviewed and updated as indicated.  Allergies and medications reviewed and updated. Data reviewed: Chart in Epic.   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   Healthcare maintenance 12/22/2016   History of subarachnoid hemorrhage 2017   SAH (subarachnoid hemorrhage) (HCC) 11/10/2016    Past Surgical History:  Procedure Laterality Date   COCHLEAR IMPLANT Right     Social History   Socioeconomic History   Marital status: Married    Spouse name: Not on file   Number of children: 2   Years of education: 12   Highest education level: 12th grade  Occupational History   Occupation: Disabled    Comment: Dentist  Tobacco Use   Smoking status: Never   Smokeless tobacco: Current    Types: Snuff  Vaping Use   Vaping status: Never Used  Substance and Sexual Activity   Alcohol use: No   Drug use: No   Sexual activity: Yes  Other Topics Concern   Not on file  Social History Narrative   Not on file  Social Drivers of Corporate investment banker Strain: Low Risk  (02/10/2018)   Overall Financial Resource Strain (CARDIA)    Difficulty of Paying Living Expenses: Not hard at all  Food Insecurity: No Food Insecurity (02/10/2018)   Hunger Vital Sign    Worried About Running Out of Food in the Last Year: Never true    Ran Out of Food in the Last Year: Never true  Transportation Needs: No Transportation Needs (02/10/2018)   PRAPARE - Administrator, Civil Service (Medical): No    Lack of Transportation  (Non-Medical): No  Physical Activity: Sufficiently Active (02/10/2018)   Exercise Vital Sign    Days of Exercise per Week: 7 days    Minutes of Exercise per Session: 60 min  Stress: No Stress Concern Present (02/10/2018)   [employer] of Occupational Health - Occupational Stress Questionnaire    Feeling of Stress : Only a little  Social Connections: Socially Integrated (02/10/2018)   Social Connection and Isolation Panel [NHANES]    Frequency of Communication with Friends and Family: More than three times a week    Frequency of Social Gatherings with Friends and Family: More than three times a week    Attends Religious Services: More than 4 times per year    Active Member of [employer] or Organizations: Yes    Attends Engineer, structural: More than 4 times per year    Marital Status: Married  Catering manager Violence: Not At Risk (02/10/2018)   Humiliation, Afraid, Rape, and Kick questionnaire    Fear of Current or Ex-Partner: No    Emotionally Abused: No    Physically Abused: No    Sexually Abused: No    Outpatient Encounter Medications as of 05/11/2024  Medication Sig   VITAMIN D  PO Take by mouth.   No facility-administered encounter medications on file as of 05/11/2024.    No Known Allergies  Review of Systems As per HPI  Objective:  BP 119/74   Pulse 67   Temp 97.7 F (36.5 C) (Temporal)   Ht 5' 10.5" (1.791 m)   Wt 182 lb 6.4 oz (82.7 kg)   SpO2 97%   BMI 25.80 kg/m    Wt Readings from Last 3 Encounters:  05/11/24 182 lb 6.4 oz (82.7 kg)  10/27/23 182 lb (82.6 kg)  07/27/23 185 lb (83.9 kg)    Physical Exam Constitutional:      General: He is awake. He is not in acute distress.    Appearance: Normal appearance. He is well-developed and well-groomed. He is not ill-appearing, toxic-appearing or diaphoretic.  HENT:     Ears:     Comments: Cochlear implant and device present on left ear  Cardiovascular:     Rate and Rhythm: Normal rate and regular  rhythm.     Pulses: Normal pulses.          Radial pulses are 2+ on the right side and 2+ on the left side.       Posterior tibial pulses are 2+ on the right side and 2+ on the left side.     Heart sounds: Normal heart sounds. No murmur heard.    No gallop.  Pulmonary:     Effort: Pulmonary effort is normal. No respiratory distress.     Breath sounds: Normal breath sounds. No stridor. No wheezing, rhonchi or rales.  Musculoskeletal:     Cervical back: Full passive range of motion without pain and neck supple.  Right lower leg: No edema.     Left lower leg: No edema.  Skin:    General: Skin is warm.     Capillary Refill: Capillary refill takes less than 2 seconds.  Neurological:     General: No focal deficit present.     Mental Status: He is alert, oriented to person, place, and time and easily aroused. Mental status is at baseline.     GCS: GCS eye subscore is 4. GCS verbal subscore is 5. GCS motor subscore is 6.     Motor: No weakness.  Psychiatric:        Attention and Perception: Attention and perception normal.        Mood and Affect: Mood and affect normal.        Speech: Speech normal.        Behavior: Behavior normal. Behavior is cooperative.        Thought Content: Thought content normal. Thought content does not include homicidal or suicidal ideation. Thought content does not include homicidal or suicidal plan.        Cognition and Memory: Cognition and memory normal.        Judgment: Judgment normal.      Results for orders placed or performed in visit on 10/27/23  Bayer DCA Hb A1c Waived   Collection Time: 10/27/23  9:04 AM  Result Value Ref Range   HB A1C (BAYER DCA - WAIVED) 5.7 (H) 4.8 - 5.6 %  CBC with Differential/Platelet   Collection Time: 10/27/23  9:07 AM  Result Value Ref Range   WBC 5.7 3.4 - 10.8 x10E3/uL   RBC 5.14 4.14 - 5.80 x10E6/uL   Hemoglobin 17.2 13.0 - 17.7 g/dL   Hematocrit 16.1 09.6 - 51.0 %   MCV 98 (H) 79 - 97 fL   MCH 33.5 (H) 26.6  - 33.0 pg   MCHC 34.1 31.5 - 35.7 g/dL   RDW 04.5 40.9 - 81.1 %   Platelets 157 150 - 450 x10E3/uL   Neutrophils 60 Not Estab. %   Lymphs 27 Not Estab. %   Monocytes 10 Not Estab. %   Eos 2 Not Estab. %   Basos 1 Not Estab. %   Neutrophils Absolute 3.4 1.4 - 7.0 x10E3/uL   Lymphocytes Absolute 1.6 0.7 - 3.1 x10E3/uL   Monocytes Absolute 0.6 0.1 - 0.9 x10E3/uL   EOS (ABSOLUTE) 0.1 0.0 - 0.4 x10E3/uL   Basophils Absolute 0.1 0.0 - 0.2 x10E3/uL   Immature Granulocytes 0 Not Estab. %   Immature Grans (Abs) 0.0 0.0 - 0.1 x10E3/uL  CMP14+EGFR   Collection Time: 10/27/23  9:07 AM  Result Value Ref Range   Glucose 92 70 - 99 mg/dL   BUN 22 8 - 27 mg/dL   Creatinine, Ser 9.14 0.76 - 1.27 mg/dL   eGFR 95 >78 GN/FAO/1.30   BUN/Creatinine Ratio 25 (H) 10 - 24   Sodium 142 134 - 144 mmol/L   Potassium 4.9 3.5 - 5.2 mmol/L   Chloride 105 96 - 106 mmol/L   CO2 23 20 - 29 mmol/L   Calcium 9.2 8.6 - 10.2 mg/dL   Total Protein 6.4 6.0 - 8.5 g/dL   Albumin 4.4 3.9 - 4.9 g/dL   Globulin, Total 2.0 1.5 - 4.5 g/dL   Bilirubin Total 0.7 0.0 - 1.2 mg/dL   Alkaline Phosphatase 68 44 - 121 IU/L   AST 18 0 - 40 IU/L   ALT 17 0 - 44 IU/L  Lipid panel  Collection Time: 10/27/23  9:07 AM  Result Value Ref Range   Cholesterol, Total 171 100 - 199 mg/dL   Triglycerides 84 0 - 149 mg/dL   HDL 36 (L) >16 mg/dL   VLDL Cholesterol Cal 16 5 - 40 mg/dL   LDL Chol Calc (NIH) 109 (H) 0 - 99 mg/dL   Chol/HDL Ratio 4.8 0.0 - 5.0 ratio  VITAMIN D  25 Hydroxy (Vit-D Deficiency, Fractures)   Collection Time: 10/27/23  9:07 AM  Result Value Ref Range   Vit D, 25-Hydroxy 34.3 30.0 - 100.0 ng/mL       10/27/2023    9:18 AM 07/27/2023    9:20 AM 02/02/2019   12:04 PM 02/10/2018    9:18 AM 12/22/2016    1:31 PM  Depression screen PHQ 2/9  Decreased Interest 0 0 0 0 0  Down, Depressed, Hopeless 0 0 0 0 0  PHQ - 2 Score 0 0 0 0 0  Altered sleeping 2 0     Tired, decreased energy 0 1     Change in appetite 0 1      Feeling bad or failure about yourself  0 0     Trouble concentrating 0 1     Moving slowly or fidgety/restless 0      Suicidal thoughts 0 0     PHQ-9 Score 2 3     Difficult doing work/chores Not difficult at all Not difficult at all          10/27/2023    9:18 AM 07/27/2023    9:20 AM  GAD 7 : Generalized Anxiety Score  Nervous, Anxious, on Edge 0 0  Control/stop worrying 0 0  Worry too much - different things 0 1  Trouble relaxing 0 0  Restless 0 0  Easily annoyed or irritable 0 0  Afraid - awful might happen 0 1  Total GAD 7 Score 0 2  Anxiety Difficulty Not difficult at all Not difficult at all   Pertinent labs & imaging results that were available during my care of the patient were reviewed by me and considered in my medical decision making.  Assessment & Plan:  Doni was seen today for medical management of chronic issues.  Diagnoses and all orders for this visit:  Mixed hyperlipidemia Labs as below. Will communicate results to patient once available. Will await results to determine next steps.  Patient to continue RYR  Reviewed ASCVD risk score.  -     CBC with Differential/Platelet -     CMP14+EGFR -     Lipid panel  Overweight (BMI 25.0-29.9) Discussed with patient to continue healthy lifestyle choices, including diet (rich in fruits, vegetables, and lean proteins, and low in salt and simple carbohydrates) and exercise (at least 30 minutes of moderate physical activity daily). Limit beverages high is sugar. Recommended at least 80-100 oz of water daily.   Prediabetes Well controlled. Continue current regimen.  -     Bayer DCA Hb A1c Waived -     CBC with Differential/Platelet -     CMP14+EGFR -     TSH  Vitamin D  deficiency Will try to add on Vitamin D  lab.   Nocturia Labs as below. Will communicate results to patient once available. Will await results to determine next steps.  -     PR PSA SCREENING  Continue all other maintenance  medications.  Follow up plan: Return in about 6 months (around 11/11/2024).   Continue healthy lifestyle choices, including  diet (rich in fruits, vegetables, and lean proteins, and low in salt and simple carbohydrates) and exercise (at least 30 minutes of moderate physical activity daily).  Written and verbal instructions provided   The above assessment and management plan was discussed with the patient. The patient verbalized understanding of and has agreed to the management plan. Patient is aware to call the clinic if they develop any new symptoms or if symptoms persist or worsen. Patient is aware when to return to the clinic for a follow-up visit. Patient educated on when it is appropriate to go to the emergency department.   Jacqualyn Mates, DNP-FNP [facility] [address] [place], [place] [address] [phone]

## 2024-05-12 ENCOUNTER — Ambulatory Visit: Payer: Self-pay

## 2024-05-12 LAB — CBC WITH DIFFERENTIAL/PLATELET
Basophils Absolute: 0.1 10*3/uL (ref 0.0–0.2)
Basos: 1 %
EOS (ABSOLUTE): 0 10*3/uL (ref 0.0–0.4)
Eos: 0 %
Hematocrit: 48.5 % (ref 37.5–51.0)
Hemoglobin: 16 g/dL (ref 13.0–17.7)
Immature Grans (Abs): 0 10*3/uL (ref 0.0–0.1)
Immature Granulocytes: 0 %
Lymphocytes Absolute: 1.5 10*3/uL (ref 0.7–3.1)
Lymphs: 18 %
MCH: 32.5 pg (ref 26.6–33.0)
MCHC: 33 g/dL (ref 31.5–35.7)
MCV: 99 fL — ABNORMAL HIGH (ref 79–97)
Monocytes Absolute: 0.8 10*3/uL (ref 0.1–0.9)
Monocytes: 10 %
Neutrophils Absolute: 5.8 10*3/uL (ref 1.4–7.0)
Neutrophils: 71 %
Platelets: 158 10*3/uL (ref 150–450)
RBC: 4.92 x10E6/uL (ref 4.14–5.80)
RDW: 12.7 % (ref 11.6–15.4)
WBC: 8.2 10*3/uL (ref 3.4–10.8)

## 2024-05-12 LAB — CMP14+EGFR
ALT: 16 IU/L (ref 0–44)
AST: 17 IU/L (ref 0–40)
Albumin: 4.4 g/dL (ref 3.9–4.9)
Alkaline Phosphatase: 66 IU/L (ref 44–121)
BUN/Creatinine Ratio: 22 (ref 10–24)
BUN: 18 mg/dL (ref 8–27)
Bilirubin Total: 0.5 mg/dL (ref 0.0–1.2)
CO2: 22 mmol/L (ref 20–29)
Calcium: 9.4 mg/dL (ref 8.6–10.2)
Chloride: 105 mmol/L (ref 96–106)
Creatinine, Ser: 0.81 mg/dL (ref 0.76–1.27)
Globulin, Total: 2.1 g/dL (ref 1.5–4.5)
Glucose: 91 mg/dL (ref 70–99)
Potassium: 4.5 mmol/L (ref 3.5–5.2)
Sodium: 141 mmol/L (ref 134–144)
Total Protein: 6.5 g/dL (ref 6.0–8.5)
eGFR: 97 mL/min/{1.73_m2} (ref >59)

## 2024-05-12 LAB — LIPID PANEL
Chol/HDL Ratio: 4.4 ratio (ref 0.0–5.0)
Cholesterol, Total: 161 mg/dL (ref 100–199)
HDL: 37 mg/dL — ABNORMAL LOW (ref >39)
LDL Chol Calc (NIH): 108 mg/dL — ABNORMAL HIGH (ref 0–99)
Triglycerides: 86 mg/dL (ref 0–149)
VLDL Cholesterol Cal: 16 mg/dL (ref 5–40)

## 2024-05-12 LAB — TSH: TSH: 2.28 u[IU]/mL (ref 0.450–4.500)

## 2024-05-12 NOTE — Progress Notes (Signed)
 Slightly improved risk. The 10-year ASCVD risk score (Arnett DK, et al., 2019) is: 12.4%. I would recommend starting a statin. However, understand if patient would like to continue with RYR. A1C is improved! Keep up the good work! Minor variation on MCV, not concerning at this time.

## 2024-05-12 NOTE — Telephone Encounter (Signed)
  Chief Complaint: lab question Symptoms: none Frequency: NA Pertinent Negatives: Patient denies NA Disposition: [] ED /[] Urgent Care (no appt availability in office) / [] Appointment(In office/virtual)/ []  [facility]/ [] Home Care/ [] Refused Recommended Disposition /[] [facility]/ [x]  Follow-up with PCP Additional Notes:  Taking vit D, red rice yeast he is almost out of these. Wondering if he still needs them based on his lab results. Would like to continue with supplements vs prescription. Also requesting for hard copies of lab results and NP lab notes to be mailed to his home.        Copied from CRM [id number]. Topic: Clinical - Lab/Test Results >> May 12, 2024  2:36 PM Zipporah Him wrote: Reason for CRM: go over lab values. Reason for Disposition  Caller requesting lab results  (Exception: Routine or non-urgent lab result.)  Protocols used: PCP Call - No Triage-A-AH

## 2024-08-16 ENCOUNTER — Ambulatory Visit

## 2024-08-16 ENCOUNTER — Telehealth: Payer: Self-pay

## 2024-08-16 VITALS — BP 119/74 | HR 67 | Ht 70.0 in | Wt 182.0 lb

## 2024-08-16 DIAGNOSIS — Z0000 Encounter for general adult medical examination without abnormal findings: Secondary | ICD-10-CM

## 2024-08-16 NOTE — Progress Notes (Signed)
 Subjective:   Dylan Reid is a 68 y.o. who presents for a Medicare Wellness preventive visit.  As a reminder, Annual Wellness Visits don't include a physical exam, and some assessments may be limited, especially if this visit is performed virtually. We may recommend an in-person follow-up visit with your provider if needed.  Visit Complete: Virtual I connected with  Dylan Reid on 08/16/24 by a audio enabled telemedicine application and verified that I am speaking with the correct person using two identifiers.  Patient Location: Home  Provider Location: Home Office  I discussed the limitations of evaluation and management by telemedicine. The patient expressed understanding and agreed to proceed.  Vital Signs: Because this visit was a virtual/telehealth visit, some criteria may be missing or patient reported. Any vitals not documented were not able to be obtained and vitals that have been documented are patient reported.  VideoDeclined- This patient declined Librarian, academic. Therefore the visit was completed with audio only.  Persons Participating in Visit: Patient.  AWV Questionnaire: No: Patient Medicare AWV questionnaire was not completed prior to this visit.  Cardiac Risk Factors include: advanced age (>22men, >74 women);dyslipidemia;smoking/ tobacco exposure     Objective:    Today's Vitals   08/16/24 1604  BP: 119/74  Pulse: 67  Weight: 182 lb (82.6 kg)  Height: 5' 10 (1.778 m)   Body mass index is 26.11 kg/m.     08/16/2024    4:10 PM 02/10/2018    9:15 AM 11/10/2016    5:00 PM 11/10/2016    8:42 AM  Advanced Directives  Does Patient Have a Medical Advance Directive? No Yes  Yes  Yes   Type of Special educational needs teacher of [place];Living will Living will;Healthcare Power of Attorney Living will  Does patient want to make changes to medical advance directive?  No - Patient declined  No - Patient declined    Copy of  Healthcare Power of Attorney in Chart?  No - copy requested  No - copy requested       Data saved with a previous flowsheet row definition    Current Medications (verified) Outpatient Encounter Medications as of 08/16/2024  Medication Sig   VITAMIN D  PO Take by mouth.   No facility-administered encounter medications on file as of 08/16/2024.    Allergies (verified) Patient has no known allergies.   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   Healthcare maintenance 12/22/2016   History of subarachnoid hemorrhage 2017   SAH (subarachnoid hemorrhage) (HCC) 11/10/2016   Past Surgical History:  Procedure Laterality Date   COCHLEAR IMPLANT Right    Family History  Problem Relation Age of Onset   Diabetes Father    Healthy Son    Healthy Son    Social History   Socioeconomic History   Marital status: Married    Spouse name: Not on file   Number of children: 2   Years of education: 12   Highest education level: 12th grade  Occupational History   Occupation: Disabled    Comment: Dentist  Tobacco Use   Smoking status: Never   Smokeless tobacco: Current    Types: Snuff  Vaping Use   Vaping status: Never Used  Substance and Sexual Activity   Alcohol use: No   Drug use: No   Sexual activity: Yes  Other Topics Concern   Not on file  Social History Narrative   Not  on file   Social Drivers of Health   Financial Resource Strain: Low Risk  (08/16/2024)   Overall Financial Resource Strain (CARDIA)    Difficulty of Paying Living Expenses: Not hard at all  Food Insecurity: No Food Insecurity (08/16/2024)   Hunger Vital Sign    Worried About Running Out of Food in the Last Year: Never true    Ran Out of Food in the Last Year: Never true  Transportation Needs: No Transportation Needs (08/16/2024)   PRAPARE - Administrator, Civil Service (Medical): No    Lack of Transportation (Non-Medical): No  Physical Activity:  Sufficiently Active (08/16/2024)   Exercise Vital Sign    Days of Exercise per Week: 7 days    Minutes of Exercise per Session: 60 min  Stress: No Stress Concern Present (08/16/2024)   [employer] of Occupational Health - Occupational Stress Questionnaire    Feeling of Stress: Not at all  Social Connections: Moderately Integrated (08/16/2024)   Social Connection and Isolation Panel    Frequency of Communication with Friends and Family: More than three times a week    Frequency of Social Gatherings with Friends and Family: More than three times a week    Attends Religious Services: 1 to 4 times per year    Active Member of [employer] or Organizations: No    Attends Banker Meetings: Never    Marital Status: Married    Tobacco Counseling Ready to quit: Not Answered Counseling given: Yes    Clinical Intake:  Pre-visit preparation completed: Yes  Pain : No/denies pain     BMI - recorded: 26.11 Nutritional Status: BMI of 19-24  Normal Nutritional Risks: None Diabetes: No  Lab Results  Component Value Date   HGBA1C 5.4 05/11/2024   HGBA1C 5.7 (H) 10/27/2023   HGBA1C 5.7 (H) 07/27/2023     How often do you need to have someone help you when you read instructions, pamphlets, or other written materials from your doctor or pharmacy?: 1 - Never  Interpreter Needed?: No  Information entered by :: alia t/cma   Activities of Daily Living     08/16/2024    4:10 PM  In your present state of health, do you have any difficulty performing the following activities:  Hearing? 1  Vision? 0  Difficulty concentrating or making decisions? 0  Walking or climbing stairs? 0  Dressing or bathing? 0  Doing errands, shopping? 0  Preparing Food and eating ? N  Using the Toilet? N  In the past six months, have you accidently leaked urine? N  Do you have problems with loss of bowel control? N  Managing your Medications? N  Managing your Finances? N  Housekeeping or managing  your Housekeeping? N    Patient Care Team: Milian, Marry Lenis, FNP (Inactive) as PCP - General (Family Medicine)  I have updated your Care Teams any recent Medical Services you may have received from other providers in the past year.     Assessment:   This is a routine wellness examination for Dylan Reid.  Hearing/Vision screen Hearing Screening - Comments:: Pt use hearing aids Vision Screening - Comments:: Pt denies vision dif/pt has not had vision check in a few yrs   Goals Addressed             This Visit's Progress    Exercise 150 min/wk Moderate Activity   On track    Continue to stay active daily  Depression Screen     08/16/2024    4:13 PM 10/27/2023    9:18 AM 07/27/2023    9:20 AM 02/02/2019   12:04 PM 02/10/2018    9:18 AM 12/22/2016    1:31 PM  PHQ 2/9 Scores  PHQ - 2 Score 0 0 0 0 0 0  PHQ- 9 Score  2 3       Fall Risk     08/16/2024    4:05 PM 10/27/2023    9:18 AM 07/27/2023    9:21 AM 02/02/2019   12:03 PM 02/10/2018    9:18 AM  Fall Risk   Falls in the past year? 0 0 0 0  No   Number falls in past yr: 0 0 0    Injury with Fall? 0 0 0    Risk for fall due to : No Fall Risks No Fall Risks No Fall Risks    Follow up Falls evaluation completed Falls evaluation completed Falls evaluation completed       Data saved with a previous flowsheet row definition    MEDICARE RISK AT HOME:  Medicare Risk at Home Any stairs in or around the home?: Yes If so, are there any without handrails?: Yes Home free of loose throw rugs in walkways, pet beds, electrical cords, etc?: Yes Adequate lighting in your home to reduce risk of falls?: Yes Life alert?: No Use of a cane, walker or w/c?: No Grab bars in the bathroom?: Yes Shower chair or bench in shower?: No Elevated toilet seat or a handicapped toilet?: No  TIMED UP AND GO:  Was the test performed?  no  Cognitive Function: 6CIT completed    02/10/2018    9:19 AM  MMSE - Mini Mental State Exam   Orientation to time 4  Orientation to Place 5  Registration 3  Attention/ Calculation 4  Recall 3  Language- name 2 objects 2  Language- repeat 1  Language- follow 3 step command 1  Language- read & follow direction 1  Write a sentence 1  Copy design 1  Total score 26        Immunizations Immunization History  Administered Date(s) Administered   Tdap 03/15/2018    Screening Tests Health Maintenance  Topic Date Due   Colonoscopy  Never done   Zoster Vaccines- Shingrix (1 of 2) Never done   Medicare Annual Wellness (AWV)  02/10/2019   INFLUENZA VACCINE  Never done   COVID-19 Vaccine (1 - 2024-25 season) Never done   Pneumococcal Vaccine: 50+ Years (1 of 2 - PCV) 05/11/2025 (Originally 04/23/1975)   DTaP/Tdap/Td (2 - Td or Tdap) 03/15/2028   Hepatitis C Screening  Completed   HPV VACCINES  Aged Out   Meningococcal B Vaccine  Aged Out    Health Maintenance  Health Maintenance Due  Topic Date Due   Colonoscopy  Never done   Zoster Vaccines- Shingrix (1 of 2) Never done   Medicare Annual Wellness (AWV)  02/10/2019   INFLUENZA VACCINE  Never done   COVID-19 Vaccine (1 - 2024-25 season) Never done   Health Maintenance Items Addressed: See Nurse Notes at the end of this note  Additional Screening:  Vision Screening: Recommended annual ophthalmology exams for early detection of glaucoma and other disorders of the eye. Would you like a referral to an eye doctor? No    Dental Screening: Recommended annual dental exams for proper oral hygiene  Community Resource Referral / Chronic Care Management: CRR required this  visit?  No   CCM required this visit?  No   Plan:    I have personally reviewed and noted the following in the patient's chart:   Medical and social history Use of alcohol, tobacco or illicit drugs  Current medications and supplements including opioid prescriptions. Patient is not currently taking opioid prescriptions. Functional ability and  status Nutritional status Physical activity Advanced directives List of other physicians Hospitalizations, surgeries, and ER visits in previous 12 months Vitals Screenings to include cognitive, depression, and falls Referrals and appointments  In addition, I have reviewed and discussed with patient certain preventive protocols, quality metrics, and best practice recommendations. A written personalized care plan for preventive services as well as general preventive health recommendations were provided to patient.   Dylan Reid, CMA   08/16/2024   After Visit Summary: (MyChart) Due to this being a telephonic visit, the after visit summary with patients personalized plan was offered to patient via MyChart   Notes: PCP Follow Up Recommendations: pt is aware and due the following: colonoscopy, flu, shingles vaccine--pt declined

## 2024-08-16 NOTE — Patient Instructions (Addendum)
 Mr. Dylan Reid , Thank you for taking time out of your busy schedule to complete your Annual Wellness Visit with me. I enjoyed our conversation and look forward to speaking with you again next year. I, as well as your care team,  appreciate your ongoing commitment to your health goals. Please review the following plan we discussed and let me know if I can assist you in the future. Your Game plan/ To Do List    Referrals: If you haven't heard from the office you've been referred to, please reach out to them at the phone provided.   Follow up Visits: We will see or speak with you next year for your Next Medicare AWV with our clinical staff on 08/21/25 at 1:10p.m. Have you seen your provider in the last 6 months (3 months if uncontrolled diabetes)? Yes  Clinician Recommendations:  Aim for 30 minutes of exercise or brisk walking, 6-8 glasses of water, and 5 servings of fruits and vegetables each day.       This is a list of the screenings recommended for you:  Health Maintenance  Topic Date Due   Colon Cancer Screening  Never done   Zoster (Shingles) Vaccine (1 of 2) Never done   Medicare Annual Wellness Visit  02/10/2019   Flu Shot  Never done   COVID-19 Vaccine (1 - 2024-25 season) Never done   Pneumococcal Vaccine for age over 29 (1 of 2 - PCV) 05/11/2025*   DTaP/Tdap/Td vaccine (2 - Td or Tdap) 03/15/2028   Hepatitis C Screening  Completed   HPV Vaccine  Aged Out   Meningitis B Vaccine  Aged Out  *Topic was postponed. The date shown is not the original due date.    Advanced directives: (Declined) Advance directive discussed with you today. Even though you declined this today, please call our office should you change your mind, and we can give you the proper paperwork for you to fill out. Advance Care Planning is important because it:  [x]  Makes sure you receive the medical care that is consistent with your values, goals, and preferences  [x]  It provides guidance to your family and loved  ones and reduces their decisional burden about whether or not they are making the right decisions based on your wishes.  Follow the link provided in your after visit summary or read over the paperwork we have mailed to you to help you started getting your Advance Directives in place. If you need assistance in completing these, please reach out to us  so that we can help you!  See attachments for Preventive Care and Fall Prevention Tips.

## 2024-08-16 NOTE — Telephone Encounter (Signed)
 Please call patient to reschedule his AWV. Call was missed and no AWV was completed.

## 2024-08-16 NOTE — Telephone Encounter (Signed)
 Copied from CRM [id number]. Topic: General - Other >> Aug 16, 2024  4:04 PM Lauren C wrote: Reason for CRM: FYI- Pt calling in asking why he had not been called for his 2:30PM AWV. Per cal, they reached out around 2:57. Pt states he had his phone in his hand since 2:31 and never received a call. CAL stated they will reach out to pt. I let pt know this will likely be to get r/s but he would like them to be aware he never received a call.

## 2024-08-16 NOTE — Telephone Encounter (Signed)
 Appt made for AWV

## 2024-10-12 ENCOUNTER — Telehealth: Payer: Self-pay | Admitting: Family Medicine

## 2024-10-12 NOTE — Telephone Encounter (Signed)
 LEFT MESSAGE TO RESCHEDULE 11/24 APPT WITH [facility]. IT HAS TO BE 30 MIN APPT SINCE HE IS SWITCHING FROM [facility] TO [facility]

## 2024-11-06 ENCOUNTER — Ambulatory Visit: Payer: Self-pay | Admitting: Family Medicine

## 2024-11-06 ENCOUNTER — Encounter: Payer: Self-pay | Admitting: Family Medicine

## 2024-11-06 VITALS — BP 134/79 | HR 67 | Temp 98.2°F | Ht 70.0 in | Wt 190.8 lb

## 2024-11-06 DIAGNOSIS — R7303 Prediabetes: Secondary | ICD-10-CM | POA: Diagnosis not present

## 2024-11-06 DIAGNOSIS — E782 Mixed hyperlipidemia: Secondary | ICD-10-CM

## 2024-11-06 DIAGNOSIS — E559 Vitamin D deficiency, unspecified: Secondary | ICD-10-CM | POA: Diagnosis not present

## 2024-11-06 DIAGNOSIS — Z0001 Encounter for general adult medical examination with abnormal findings: Secondary | ICD-10-CM

## 2024-11-06 DIAGNOSIS — Z0000 Encounter for general adult medical examination without abnormal findings: Secondary | ICD-10-CM

## 2024-11-06 DIAGNOSIS — Z8679 Personal history of other diseases of the circulatory system: Secondary | ICD-10-CM

## 2024-11-06 LAB — BAYER DCA HB A1C WAIVED: HB A1C (BAYER DCA - WAIVED): 5.6 % (ref 4.8–5.6)

## 2024-11-06 LAB — LIPID PANEL

## 2024-11-06 NOTE — Progress Notes (Signed)
 Complete physical exam  Patient: Dylan Reid   DOB: 1956/05/11   68 y.o. Male  MRN: [id number]  Subjective:    Chief Complaint  Patient presents with   Annual Exam    Dylan Reid is a 68 y.o. male who presents today for a complete physical exam. He reports consuming a general diet. The patient does not participate in regular exercise at present. [place] all days. He generally feels well. He reports sleeping well. He does not have additional problems to discuss today.    Most recent fall risk assessment:    08/16/2024    4:05 PM  Fall Risk   Falls in the past year? 0  Number falls in past yr: 0  Injury with Fall? 0  Risk for fall due to : No Fall Risks  Follow up Falls evaluation completed     Most recent depression screenings:    08/16/2024    4:13 PM 10/27/2023    9:18 AM  PHQ 2/9 Scores  PHQ - 2 Score 0 0  PHQ- 9 Score  2      Data saved with a previous flowsheet row definition    Vision:Not within last year  and Dental: No current dental problems and No regular dental care   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   Healthcare maintenance 12/22/2016   History of subarachnoid hemorrhage 2017   SAH (subarachnoid hemorrhage) (HCC) 11/10/2016      Patient Care Team: Dylan Annabella HERO, FNP as PCP - General (Family Medicine)   Outpatient Medications Prior to Visit  Medication Sig   Red Yeast Rice Extract (RED YEAST RICE PO) Take by mouth.   VITAMIN D  PO Take by mouth.   No facility-administered medications prior to visit.    ROS Negative unless specially indicated above in HPI.     Objective:     BP 134/79   Pulse 67   Temp 98.2 F (36.8 C) (Temporal)   Ht 5' 10 (1.778 m)   Wt 190 lb 12.8 oz (86.5 kg)   SpO2 98%   BMI 27.38 kg/m    Physical Exam Vitals and nursing note reviewed.  Constitutional:      General: He is not in acute distress.    Appearance: He is not  ill-appearing, toxic-appearing or diaphoretic.  HENT:     Head: Normocephalic.     Right Ear: Tympanic membrane, ear canal and external ear normal.     Left Ear: Tympanic membrane, ear canal and external ear normal.     Nose: Nose normal.     Mouth/Throat:     Mouth: Mucous membranes are moist.     Pharynx: Oropharynx is clear.  Eyes:     Extraocular Movements: Extraocular movements intact.     Conjunctiva/sclera: Conjunctivae normal.     Pupils: Pupils are equal, round, and reactive to light.  Cardiovascular:     Rate and Rhythm: Normal rate and regular rhythm.     Pulses: Normal pulses.     Heart sounds: Normal heart sounds. No murmur heard.    No friction rub. No gallop.  Pulmonary:     Effort: Pulmonary effort is normal.     Breath sounds: Normal breath sounds.  Abdominal:     General: Bowel sounds are normal. There is no distension.     Palpations: Abdomen is soft. There is no mass.  Tenderness: There is no abdominal tenderness. There is no guarding.  Musculoskeletal:        General: No swelling. Normal range of motion.     Cervical back: Normal range of motion and neck supple. No tenderness.     Right lower leg: No edema.     Left lower leg: No edema.  Skin:    General: Skin is warm and dry.     Capillary Refill: Capillary refill takes less than 2 seconds.     Findings: No lesion or rash.  Neurological:     General: No focal deficit present.     Mental Status: He is alert and oriented to person, place, and time.  Psychiatric:        Mood and Affect: Mood normal.        Behavior: Behavior normal.        Thought Content: Thought content normal.      No results found for any visits on 11/06/24.     Assessment & Plan:    Routine Health Maintenance and Physical Exam  Dylan Reid was seen today for annual exam.  Diagnoses and all orders for this visit:  Routine general medical examination at a health care facility  Prediabetes -     CBC with  Differential/Platelet -     CMP14+EGFR -     Bayer DCA Hb A1c Waived  Vitamin D  deficiency -     VITAMIN D  25 Hydroxy (Vit-D Deficiency, Fractures)  Mixed hyperlipidemia -     Lipid panel  History of subarachnoid hemorrhage   Assessment and Plan    Adult Wellness Visit Routine visit with no acute concerns. Blood pressure controlled. Declined PSA screening. - Ordered lab work: A1c, cholesterol, kidney, liver function tests. - Scheduled follow-up in six months.  Prediabetes Well balanced diet and physical activity - Ordered A1c test. - Scheduled follow-up in six months.  Vitamin D  deficiency Taking vitamin D  supplement. - Ordered vitamin D  level test.  Mixed hyperlipidemia Taking red yeast rice supplement. Declined statin.  - Ordered cholesterol test.       Immunization History  Administered Date(s) Administered   Tdap 03/15/2018    Health Maintenance  Topic Date Due   Influenza Vaccine  03/13/2025 (Originally 07/14/2024)   Pneumococcal Vaccine: 50+ Years (1 of 2 - PCV) 05/11/2025 (Originally 04/23/1977)   Colonoscopy  11/06/2025 (Originally 04/24/2003)   COVID-19 Vaccine (1 - 2025-26 season) 11/22/2025 (Originally 08/14/2024)   Zoster Vaccines- Shingrix (1 of 2) 02/06/2026 (Originally 04/23/2008)   Medicare Annual Wellness (AWV)  08/16/2025   DTaP/Tdap/Td (2 - Td or Tdap) 03/15/2028   Hepatitis C Screening  Completed   Meningococcal B Vaccine  Aged Out    Discussed health benefits of physical activity, and encouraged him to engage in regular exercise appropriate for his age and condition.  Problem List Items Addressed This Visit       Other   History of subarachnoid hemorrhage   Prediabetes   Relevant Orders   CBC with Differential/Platelet   CMP14+EGFR   Bayer DCA Hb A1c Waived   Mixed hyperlipidemia   Relevant Orders   Lipid panel   Vitamin D  deficiency   Relevant Orders   VITAMIN D  25 Hydroxy (Vit-D Deficiency, Fractures)   Other Visit Diagnoses        Routine general medical examination at a health care facility    -  Primary      Return in 6 months (on 05/06/2025) for chronic follow up.  The patient indicates understanding of these issues and agrees with the plan.  Annabella CHRISTELLA Search, FNP

## 2024-11-06 NOTE — Patient Instructions (Signed)
 Health Maintenance, Male  Adopting a healthy lifestyle and getting preventive care are important in promoting health and wellness. Ask your health care provider about:  The right schedule for you to have regular tests and exams.  Things you can do on your own to prevent diseases and keep yourself healthy.  What should I know about diet, weight, and exercise?  Eat a healthy diet    Eat a diet that includes plenty of vegetables, fruits, low-fat dairy products, and lean protein.  Do not eat a lot of foods that are high in solid fats, added sugars, or sodium.  Maintain a healthy weight  Body mass index (BMI) is a measurement that can be used to identify possible weight problems. It estimates body fat based on height and weight. Your health care provider can help determine your BMI and help you achieve or maintain a healthy weight.  Get regular exercise  Get regular exercise. This is one of the most important things you can do for your health. Most adults should:  Exercise for at least 150 minutes each week. The exercise should increase your heart rate and make you sweat (moderate-intensity exercise).  Do strengthening exercises at least twice a week. This is in addition to the moderate-intensity exercise.  Spend less time sitting. Even light physical activity can be beneficial.  Watch cholesterol and blood lipids  Have your blood tested for lipids and cholesterol at 66 years of age, then have this test every 5 years.  You may need to have your cholesterol levels checked more often if:  Your lipid or cholesterol levels are high.  You are older than 66 years of age.  You are at high risk for heart disease.  What should I know about cancer screening?  Many types of cancers can be detected early and may often be prevented. Depending on your health history and family history, you may need to have cancer screening at various ages. This may include screening for:  Colorectal cancer.  Prostate cancer.  Skin cancer.  Lung  cancer.  What should I know about heart disease, diabetes, and high blood pressure?  Blood pressure and heart disease  High blood pressure causes heart disease and increases the risk of stroke. This is more likely to develop in people who have high blood pressure readings or are overweight.  Talk with your health care provider about your target blood pressure readings.  Have your blood pressure checked:  Every 3-5 years if you are 24-52 years of age.  Every year if you are 3 years old or older.  If you are between the ages of 60 and 72 and are a current or former smoker, ask your health care provider if you should have a one-time screening for abdominal aortic aneurysm (AAA).  Diabetes  Have regular diabetes screenings. This checks your fasting blood sugar level. Have the screening done:  Once every three years after age 66 if you are at a normal weight and have a low risk for diabetes.  More often and at a younger age if you are overweight or have a high risk for diabetes.  What should I know about preventing infection?  Hepatitis B  If you have a higher risk for hepatitis B, you should be screened for this virus. Talk with your health care provider to find out if you are at risk for hepatitis B infection.  Hepatitis C  Blood testing is recommended for:  Everyone born from [birth year] through 1963.  Anyone  with known risk factors for hepatitis C.  Sexually transmitted infections (STIs)  You should be screened each year for STIs, including gonorrhea and chlamydia, if:  You are sexually active and are younger than 66 years of age.  You are older than 66 years of age and your health care provider tells you that you are at risk for this type of infection.  Your sexual activity has changed since you were last screened, and you are at increased risk for chlamydia or gonorrhea. Ask your health care provider if you are at risk.  Ask your health care provider about whether you are at high risk for HIV. Your health care provider  may recommend a prescription medicine to help prevent HIV infection. If you choose to take medicine to prevent HIV, you should first get tested for HIV. You should then be tested every 3 months for as long as you are taking the medicine.  Follow these instructions at home:  Alcohol use  Do not drink alcohol if your health care provider tells you not to drink.  If you drink alcohol:  Limit how much you have to 0-2 drinks a day.  Know how much alcohol is in your drink. In the [place] one drink equals one 12 oz bottle of beer (355 mL), one 5 oz glass of wine (148 mL), or one 1 oz glass of hard liquor (44 mL).  Lifestyle  Do not use any products that contain nicotine or tobacco. These products include cigarettes, chewing tobacco, and vaping devices, such as e-cigarettes. If you need help quitting, ask your health care provider.  Do not use street drugs.  Do not share needles.  Ask your health care provider for help if you need support or information about quitting drugs.  General instructions  Schedule regular health, dental, and eye exams.  Stay current with your vaccines.  Tell your health care provider if:  You often feel depressed.  You have ever been abused or do not feel safe at home.  Summary  Adopting a healthy lifestyle and getting preventive care are important in promoting health and wellness.  Follow your health care provider's instructions about healthy diet, exercising, and getting tested or screened for diseases.  Follow your health care provider's instructions on monitoring your cholesterol and blood pressure.  This information is not intended to replace advice given to you by your health care provider. Make sure you discuss any questions you have with your health care provider.  Document Revised: 04/21/2021 Document Reviewed: 04/21/2021  Elsevier Patient Education  2024 [employer].

## 2024-11-07 LAB — CBC WITH DIFFERENTIAL/PLATELET
Basophils Absolute: 0.1 x10E3/uL (ref 0.0–0.2)
Basos: 1 %
EOS (ABSOLUTE): 0.1 x10E3/uL (ref 0.0–0.4)
Eos: 1 %
Hematocrit: 51.3 % — ABNORMAL HIGH (ref 37.5–51.0)
Hemoglobin: 17 g/dL (ref 13.0–17.7)
Immature Grans (Abs): 0 x10E3/uL (ref 0.0–0.1)
Immature Granulocytes: 0 %
Lymphocytes Absolute: 1.8 x10E3/uL (ref 0.7–3.1)
Lymphs: 27 %
MCH: 32.9 pg (ref 26.6–33.0)
MCHC: 33.1 g/dL (ref 31.5–35.7)
MCV: 99 fL — ABNORMAL HIGH (ref 79–97)
Monocytes Absolute: 0.7 x10E3/uL (ref 0.1–0.9)
Monocytes: 11 %
Neutrophils Absolute: 3.9 x10E3/uL (ref 1.4–7.0)
Neutrophils: 60 %
Platelets: 185 x10E3/uL (ref 150–450)
RBC: 5.17 x10E6/uL (ref 4.14–5.80)
RDW: 12.3 % (ref 11.6–15.4)
WBC: 6.6 x10E3/uL (ref 3.4–10.8)

## 2024-11-07 LAB — LIPID PANEL
Cholesterol, Total: 177 mg/dL (ref 100–199)
HDL: 40 mg/dL (ref >39)
LDL CALC COMMENT:: 4.4 ratio (ref 0.0–5.0)
LDL Chol Calc (NIH): 123 mg/dL — AB (ref 0–99)
Triglycerides: 75 mg/dL (ref 0–149)
VLDL Cholesterol Cal: 14 mg/dL (ref 5–40)

## 2024-11-07 LAB — CMP14+EGFR
ALT: 16 IU/L (ref 0–44)
AST: 17 IU/L (ref 0–40)
Albumin: 4.4 g/dL (ref 3.9–4.9)
Alkaline Phosphatase: 66 IU/L (ref 47–123)
BUN/Creatinine Ratio: 20 (ref 10–24)
BUN: 19 mg/dL (ref 8–27)
Bilirubin Total: 0.6 mg/dL (ref 0.0–1.2)
CO2: 22 mmol/L (ref 20–29)
Calcium: 9.7 mg/dL (ref 8.6–10.2)
Chloride: 105 mmol/L (ref 96–106)
Creatinine, Ser: 0.94 mg/dL (ref 0.76–1.27)
Globulin, Total: 2.6 g/dL (ref 1.5–4.5)
Glucose: 106 mg/dL — ABNORMAL HIGH (ref 70–99)
Potassium: 5 mmol/L (ref 3.5–5.2)
Sodium: 141 mmol/L (ref 134–144)
Total Protein: 7 g/dL (ref 6.0–8.5)
eGFR: 89 mL/min/1.73 (ref >59)

## 2024-11-07 LAB — VITAMIN D 25 HYDROXY (VIT D DEFICIENCY, FRACTURES): Vit D, 25-Hydroxy: 34 ng/mL (ref 30.0–100.0)

## 2024-11-08 ENCOUNTER — Ambulatory Visit: Payer: Self-pay | Admitting: Family Medicine

## 2025-05-03 ENCOUNTER — Ambulatory Visit: Admitting: Family Medicine

## 2025-08-21 ENCOUNTER — Ambulatory Visit: Payer: Self-pay
